# Patient Record
Sex: Female | Born: 1935 | Race: White | Hispanic: No | Smoking: Former smoker
Health system: Southern US, Community
[De-identification: ages and names within clinical notes are randomized; demographics above are authoritative.]

## PROBLEM LIST (undated history)

## (undated) DIAGNOSIS — I10 Essential (primary) hypertension: Secondary | ICD-10-CM

## (undated) DIAGNOSIS — J189 Pneumonia, unspecified organism: Secondary | ICD-10-CM

## (undated) DIAGNOSIS — E7800 Pure hypercholesterolemia, unspecified: Secondary | ICD-10-CM

## (undated) DIAGNOSIS — T4145XA Adverse effect of unspecified anesthetic, initial encounter: Secondary | ICD-10-CM

## (undated) DIAGNOSIS — R0602 Shortness of breath: Secondary | ICD-10-CM

## (undated) DIAGNOSIS — M7070 Other bursitis of hip, unspecified hip: Secondary | ICD-10-CM

## (undated) DIAGNOSIS — R413 Other amnesia: Secondary | ICD-10-CM

## (undated) DIAGNOSIS — J40 Bronchitis, not specified as acute or chronic: Secondary | ICD-10-CM

## (undated) DIAGNOSIS — F419 Anxiety disorder, unspecified: Secondary | ICD-10-CM

## (undated) DIAGNOSIS — K219 Gastro-esophageal reflux disease without esophagitis: Secondary | ICD-10-CM

## (undated) DIAGNOSIS — T8859XA Other complications of anesthesia, initial encounter: Secondary | ICD-10-CM

## (undated) DIAGNOSIS — F32A Depression, unspecified: Secondary | ICD-10-CM

## (undated) DIAGNOSIS — F329 Major depressive disorder, single episode, unspecified: Secondary | ICD-10-CM

## (undated) HISTORY — PX: CATARACT EXTRACTION: SUR2

## (undated) HISTORY — PX: CHOLECYSTECTOMY: SHX55

## (undated) HISTORY — PX: KNEE SURGERY: SHX244

---

## 1998-03-03 ENCOUNTER — Ambulatory Visit (HOSPITAL_COMMUNITY): Admission: RE | Admit: 1998-03-03 | Discharge: 1998-03-03 | Payer: Self-pay | Admitting: Obstetrics and Gynecology

## 1999-01-29 ENCOUNTER — Emergency Department (HOSPITAL_COMMUNITY): Admission: EM | Admit: 1999-01-29 | Discharge: 1999-01-29 | Payer: Self-pay | Admitting: Emergency Medicine

## 1999-01-29 ENCOUNTER — Encounter: Payer: Self-pay | Admitting: Emergency Medicine

## 1999-03-27 ENCOUNTER — Other Ambulatory Visit: Admission: RE | Admit: 1999-03-27 | Discharge: 1999-03-27 | Payer: Self-pay | Admitting: *Deleted

## 2000-05-01 ENCOUNTER — Encounter: Admission: RE | Admit: 2000-05-01 | Discharge: 2000-05-01 | Payer: Self-pay | Admitting: *Deleted

## 2000-05-10 ENCOUNTER — Encounter: Admission: RE | Admit: 2000-05-10 | Discharge: 2000-05-10 | Payer: Self-pay | Admitting: *Deleted

## 2000-07-05 ENCOUNTER — Ambulatory Visit (HOSPITAL_COMMUNITY): Admission: RE | Admit: 2000-07-05 | Discharge: 2000-07-05 | Payer: Self-pay | Admitting: Gastroenterology

## 2001-06-09 ENCOUNTER — Ambulatory Visit: Admission: RE | Admit: 2001-06-09 | Discharge: 2001-06-09 | Payer: Self-pay | Admitting: *Deleted

## 2002-07-16 ENCOUNTER — Encounter: Admission: RE | Admit: 2002-07-16 | Discharge: 2002-07-16 | Payer: Self-pay | Admitting: *Deleted

## 2002-07-16 ENCOUNTER — Encounter: Payer: Self-pay | Admitting: *Deleted

## 2002-07-24 ENCOUNTER — Encounter: Payer: Self-pay | Admitting: *Deleted

## 2002-07-24 ENCOUNTER — Ambulatory Visit (HOSPITAL_COMMUNITY): Admission: RE | Admit: 2002-07-24 | Discharge: 2002-07-24 | Payer: Self-pay | Admitting: *Deleted

## 2002-08-20 ENCOUNTER — Other Ambulatory Visit: Admission: RE | Admit: 2002-08-20 | Discharge: 2002-08-20 | Payer: Self-pay | Admitting: Obstetrics and Gynecology

## 2013-02-14 DIAGNOSIS — F329 Major depressive disorder, single episode, unspecified: Secondary | ICD-10-CM | POA: Insufficient documentation

## 2013-02-14 DIAGNOSIS — E7800 Pure hypercholesterolemia, unspecified: Secondary | ICD-10-CM | POA: Insufficient documentation

## 2013-02-14 DIAGNOSIS — R490 Dysphonia: Secondary | ICD-10-CM | POA: Insufficient documentation

## 2013-02-14 DIAGNOSIS — M1610 Unilateral primary osteoarthritis, unspecified hip: Secondary | ICD-10-CM | POA: Insufficient documentation

## 2013-02-14 DIAGNOSIS — I10 Essential (primary) hypertension: Secondary | ICD-10-CM | POA: Insufficient documentation

## 2013-02-14 DIAGNOSIS — M76899 Other specified enthesopathies of unspecified lower limb, excluding foot: Secondary | ICD-10-CM | POA: Insufficient documentation

## 2013-02-14 DIAGNOSIS — K219 Gastro-esophageal reflux disease without esophagitis: Secondary | ICD-10-CM | POA: Insufficient documentation

## 2013-04-03 DIAGNOSIS — F411 Generalized anxiety disorder: Secondary | ICD-10-CM | POA: Insufficient documentation

## 2013-11-11 ENCOUNTER — Emergency Department (HOSPITAL_BASED_OUTPATIENT_CLINIC_OR_DEPARTMENT_OTHER)
Admission: EM | Admit: 2013-11-11 | Discharge: 2013-11-11 | Disposition: A | Discharge status: Home / Self Care | Payer: Medicare Other | Attending: Emergency Medicine | Admitting: Emergency Medicine

## 2013-11-11 ENCOUNTER — Encounter (HOSPITAL_BASED_OUTPATIENT_CLINIC_OR_DEPARTMENT_OTHER): Payer: Self-pay | Admitting: Emergency Medicine

## 2013-11-11 ENCOUNTER — Emergency Department (HOSPITAL_BASED_OUTPATIENT_CLINIC_OR_DEPARTMENT_OTHER): Payer: Medicare Other

## 2013-11-11 DIAGNOSIS — F329 Major depressive disorder, single episode, unspecified: Secondary | ICD-10-CM | POA: Insufficient documentation

## 2013-11-11 DIAGNOSIS — Z87891 Personal history of nicotine dependence: Secondary | ICD-10-CM | POA: Insufficient documentation

## 2013-11-11 DIAGNOSIS — I10 Essential (primary) hypertension: Secondary | ICD-10-CM | POA: Insufficient documentation

## 2013-11-11 DIAGNOSIS — Z79899 Other long term (current) drug therapy: Secondary | ICD-10-CM | POA: Insufficient documentation

## 2013-11-11 DIAGNOSIS — F411 Generalized anxiety disorder: Secondary | ICD-10-CM | POA: Insufficient documentation

## 2013-11-11 DIAGNOSIS — Z7982 Long term (current) use of aspirin: Secondary | ICD-10-CM | POA: Insufficient documentation

## 2013-11-11 DIAGNOSIS — R05 Cough: Secondary | ICD-10-CM

## 2013-11-11 DIAGNOSIS — Z8719 Personal history of other diseases of the digestive system: Secondary | ICD-10-CM | POA: Insufficient documentation

## 2013-11-11 DIAGNOSIS — R059 Cough, unspecified: Secondary | ICD-10-CM | POA: Insufficient documentation

## 2013-11-11 DIAGNOSIS — E7800 Pure hypercholesterolemia, unspecified: Secondary | ICD-10-CM | POA: Insufficient documentation

## 2013-11-11 DIAGNOSIS — Z8739 Personal history of other diseases of the musculoskeletal system and connective tissue: Secondary | ICD-10-CM | POA: Insufficient documentation

## 2013-11-11 DIAGNOSIS — Z8709 Personal history of other diseases of the respiratory system: Secondary | ICD-10-CM | POA: Insufficient documentation

## 2013-11-11 DIAGNOSIS — F3289 Other specified depressive episodes: Secondary | ICD-10-CM | POA: Insufficient documentation

## 2013-11-11 HISTORY — DX: Other bursitis of hip, unspecified hip: M70.70

## 2013-11-11 HISTORY — DX: Shortness of breath: R06.02

## 2013-11-11 HISTORY — DX: Major depressive disorder, single episode, unspecified: F32.9

## 2013-11-11 HISTORY — DX: Gastro-esophageal reflux disease without esophagitis: K21.9

## 2013-11-11 HISTORY — DX: Bronchitis, not specified as acute or chronic: J40

## 2013-11-11 HISTORY — DX: Depression, unspecified: F32.A

## 2013-11-11 HISTORY — DX: Pure hypercholesterolemia, unspecified: E78.00

## 2013-11-11 HISTORY — DX: Essential (primary) hypertension: I10

## 2013-11-11 HISTORY — DX: Anxiety disorder, unspecified: F41.9

## 2013-11-11 HISTORY — DX: Other amnesia: R41.3

## 2013-11-11 LAB — COMPREHENSIVE METABOLIC PANEL
ALBUMIN: 3.6 g/dL (ref 3.5–5.2)
ALT: 14 U/L (ref 0–35)
AST: 22 U/L (ref 0–37)
Alkaline Phosphatase: 104 U/L (ref 39–117)
BILIRUBIN TOTAL: 0.4 mg/dL (ref 0.3–1.2)
BUN: 18 mg/dL (ref 6–23)
CALCIUM: 10.1 mg/dL (ref 8.4–10.5)
CHLORIDE: 103 meq/L (ref 96–112)
CO2: 25 mEq/L (ref 19–32)
CREATININE: 0.7 mg/dL (ref 0.50–1.10)
GFR calc Af Amer: 88 mL/min — ABNORMAL LOW (ref >90)
GFR calc non Af Amer: 76 mL/min — ABNORMAL LOW (ref >90)
Glucose, Bld: 106 mg/dL — ABNORMAL HIGH (ref 70–99)
Potassium: 4.3 mEq/L (ref 3.7–5.3)
Sodium: 142 mEq/L (ref 137–147)
Total Protein: 7.2 g/dL (ref 6.0–8.3)

## 2013-11-11 LAB — CBC WITH DIFFERENTIAL/PLATELET
Basophils Absolute: 0 10*3/uL (ref 0.0–0.1)
Basophils Relative: 1 % (ref 0–1)
Eosinophils Absolute: 0.2 10*3/uL (ref 0.0–0.7)
Eosinophils Relative: 2 % (ref 0–5)
HCT: 39.1 % (ref 36.0–46.0)
HEMOGLOBIN: 12.9 g/dL (ref 12.0–15.0)
LYMPHS ABS: 1.5 10*3/uL (ref 0.7–4.0)
LYMPHS PCT: 22 % (ref 12–46)
MCH: 29.9 pg (ref 26.0–34.0)
MCHC: 33 g/dL (ref 30.0–36.0)
MCV: 90.7 fL (ref 78.0–100.0)
MONOS PCT: 14 % — AB (ref 3–12)
Monocytes Absolute: 1 10*3/uL (ref 0.1–1.0)
NEUTROS PCT: 62 % (ref 43–77)
Neutro Abs: 4.3 10*3/uL (ref 1.7–7.7)
Platelets: 250 10*3/uL (ref 150–400)
RBC: 4.31 MIL/uL (ref 3.87–5.11)
RDW: 14.2 % (ref 11.5–15.5)
WBC: 7 10*3/uL (ref 4.0–10.5)

## 2013-11-11 LAB — PRO B NATRIURETIC PEPTIDE: Pro B Natriuretic peptide (BNP): 352 pg/mL (ref 0–450)

## 2013-11-11 LAB — TROPONIN I

## 2013-11-11 NOTE — ED Notes (Signed)
Pt from [facility].  States that she has had SOB x 2 weeks, worse with exertion.  Denies pain.  Reports cough with mucus.

## 2013-11-11 NOTE — ED Provider Notes (Signed)
CSN: [id number]     Arrival date & time 11/11/13  1556 History   First MD Initiated Contact with Patient 11/11/13 1640     Chief Complaint  Patient presents with  . Shortness of Breath     (Consider location/radiation/quality/duration/timing/severity/associated sxs/prior Treatment) Patient is a 80 y.o. female presenting with shortness of breath. The history is provided by the patient. No language interpreter was used.  Shortness of Breath Severity:  Mild Onset quality:  Gradual Duration:  2 weeks Timing:  Constant Progression:  Worsening Chronicity:  New Context: activity   Relieved by:  Nothing Worsened by:  Nothing tried Ineffective treatments:  None tried Associated symptoms: cough   Pt complains of a cough for the past 2 weeks.  Pt reports some increased shortness of breath. Pt reports she went to the [facility] and she was told to come here.   Pt reports her heart rate was high at 120 and then dropped to 60.    Past Medical History  Diagnosis Date  . Bronchitis   . Anxiety   . Bursitis of hip   . Depression   . GERD (gastroesophageal reflux disease)   . Hypercholesteremia   . Hypertension   . Memory loss   . Shortness of breath    Past Surgical History  Procedure Laterality Date  . Cholecystectomy    . Knee surgery     History reviewed. No pertinent family history. History  Substance Use Topics  . Smoking status: Former Games developer  . Smokeless tobacco: Not on file  . Alcohol Use: No   OB History   Grav Para Term Preterm Abortions TAB SAB Ect Mult Living                 Review of Systems  Respiratory: Positive for cough and shortness of breath.   All other systems reviewed and are negative.      Allergies  Review of patient's allergies indicates no known allergies.  Home Medications   Current Outpatient Rx  Name  Route  Sig  Dispense  Refill  . Ascorbic Acid (VITAMIN C) 100 MG tablet   Oral   Take 100 mg by mouth daily.         Marland Kitchen aspirin  81 MG tablet   Oral   Take 81 mg by mouth daily.         Marland Kitchen atorvastatin (LIPITOR) 10 MG tablet   Oral   Take 10 mg by mouth daily.         . cholecalciferol (VITAMIN D) 1000 UNITS tablet   Oral   Take 1,000 Units by mouth daily.         Marland Kitchen diltiazem (CARDIZEM) 120 MG tablet   Oral   Take 180 mg by mouth 4 (four) times daily.         . Multiple Vitamins-Minerals (OSTEO COMPLEX PO)   Oral   Take by mouth.         . venlafaxine (EFFEXOR) 75 MG tablet   Oral   Take 75 mg by mouth 2 (two) times daily.         . vitamin B-12 (CYANOCOBALAMIN) 100 MCG tablet   Oral   Take 100 mcg by mouth daily.          BP 110/76  Pulse 87  Temp(Src) 98.4 F (36.9 C) (Oral)  Resp 18  Ht 5' (1.524 m)  Wt 125 lb (56.7 kg)  BMI 24.41 kg/m2  SpO2 96% Physical Exam  Nursing note and vitals reviewed. Constitutional: She is oriented to person, place, and time. She appears well-developed and well-nourished.  HENT:  Head: Normocephalic.  Right Ear: External ear normal.  Left Ear: External ear normal.  Nose: Nose normal.  Mouth/Throat: Oropharynx is clear and moist.  Eyes: Conjunctivae and EOM are normal. Pupils are equal, round, and reactive to light.  Neck: Normal range of motion.  Cardiovascular: Normal rate and intact distal pulses.   Pulmonary/Chest: Effort normal and breath sounds normal.  Abdominal: Soft. She exhibits no distension.  Musculoskeletal: Normal range of motion.  Neurological: She is alert and oriented to person, place, and time.  Skin: Skin is warm.  Psychiatric: She has a normal mood and affect.    ED Course  Procedures (including critical care time) Labs Review Labs Reviewed  COMPREHENSIVE METABOLIC PANEL - Abnormal; Notable for the following:    Glucose, Bld 106 (*)    GFR calc non Af Amer 76 (*)    GFR calc Af Amer 88 (*)    All other components within normal limits  CBC WITH DIFFERENTIAL - Abnormal; Notable for the following:    Monocytes  Relative 14 (*)    All other components within normal limits  TROPONIN I  PRO B NATRIURETIC PEPTIDE   Imaging Review Dg Chest 2 View  11/11/2013   CLINICAL DATA:  Progressive dyspnea and palpitations  EXAM: CHEST  2 VIEW  COMPARISON:  None.  FINDINGS: The lungs are mildly hyperinflated. The interstitial markings increased. The cardiac silhouette is normal in size. There is mild tortuosity of the descending thoracic aorta. The pulmonary vascularity is not engorged. There is degenerative disc change at multiple thoracic levels with mild loss of height of the lower thoracic vertebral bodies.  IMPRESSION: The findings are consistent with COPD. There may be an element of pulmonary fibrosis present. There is no evidence of CHF nor of pneumonia.   Electronically Signed   By: David  SwazilandJordan   On: 11/11/2013 17:36     EKG Interpretation None      MDM   Final diagnoses:  Cough    Labs are normal,   Ekg nonacute.   No pneumonia on chest xray.   Pt advised to follow up with her MD.      Elson AreasLeslie K Sofia, PA-C 11/11/13 1848

## 2013-11-11 NOTE — Discharge Instructions (Signed)
Cough, Adult  A cough is a reflex. It helps you clear your throat and airways. A cough can help heal your body. A cough can last 2 or 3 weeks (acute) or may last more than 8 weeks (chronic). Some common causes of a cough can include an infection, allergy, or a cold. HOME CARE  Only take medicine as told by your doctor.  If given, take your medicines (antibiotics) as told. Finish them even if you start to feel better.  Use a cold steam vaporizer or humidier in your home. This can help loosen thick spit (secretions).  Sleep so you are almost sitting up (semi-upright). Use pillows to do this. This helps reduce coughing.  Rest as needed.  Stop smoking if you smoke. GET HELP RIGHT AWAY IF:  You have yellowish-white fluid (pus) in your thick spit.  Your cough gets worse.  Your medicine does not reduce coughing, and you are losing sleep.  You cough up blood.  You have trouble breathing.  Your pain gets worse and medicine does not help.  You have a fever. MAKE SURE YOU:   Understand these instructions.  Will watch your condition.  Will get help right away if you are not doing well or get worse. Document Released: 05/10/2011 Document Revised: 11/19/2011 Document Reviewed: 05/10/2011 Exit[employer] Patient Information 2014 [employer].  

## 2013-11-11 NOTE — ED Notes (Signed)
Intermittently, patietn's sat's will drop as low as 87% with no exertion. Pt becomes shob.

## 2013-11-18 NOTE — ED Provider Notes (Signed)
Medical screening examination/treatment/procedure(s) were performed by non-physician practitioner and as supervising physician I was immediately available for consultation/collaboration.   EKG Interpretation   Date/Time:  Wednesday November 11 2013 16:11:50 EST Ventricular Rate:  86 PR Interval:  168 QRS Duration: 74 QT Interval:  348 QTC Calculation: 416 R Axis:   -54 Text Interpretation:  Normal sinus rhythm Left axis deviation Inferior  infarct , age undetermined Anterior infarct , age undetermined Abnormal  ECG Confirmed by Fayrene FearingJAMES  MD, Shaunak Kreis ([id number]) on 11/11/2013 11:20:50 PM        Rolland PorterMark Grettell Ransdell, MD 11/18/13 [id number]

## 2013-12-16 DIAGNOSIS — M25519 Pain in unspecified shoulder: Secondary | ICD-10-CM | POA: Insufficient documentation

## 2014-10-12 DIAGNOSIS — R413 Other amnesia: Secondary | ICD-10-CM | POA: Insufficient documentation

## 2016-07-10 DIAGNOSIS — R0789 Other chest pain: Secondary | ICD-10-CM | POA: Insufficient documentation

## 2016-07-10 DIAGNOSIS — R000 Tachycardia, unspecified: Secondary | ICD-10-CM | POA: Insufficient documentation

## 2016-09-11 ENCOUNTER — Encounter (HOSPITAL_BASED_OUTPATIENT_CLINIC_OR_DEPARTMENT_OTHER): Payer: Self-pay | Admitting: *Deleted

## 2016-09-11 ENCOUNTER — Inpatient Hospital Stay (HOSPITAL_BASED_OUTPATIENT_CLINIC_OR_DEPARTMENT_OTHER)
Admission: EM | Admit: 2016-09-11 | Discharge: 2016-09-17 | DRG: 871 | Disposition: A | Discharge status: Skilled Nursing Facility | Payer: Medicare Other | Attending: Internal Medicine | Admitting: Internal Medicine

## 2016-09-11 ENCOUNTER — Emergency Department (HOSPITAL_BASED_OUTPATIENT_CLINIC_OR_DEPARTMENT_OTHER): Payer: Medicare Other

## 2016-09-11 DIAGNOSIS — I480 Paroxysmal atrial fibrillation: Secondary | ICD-10-CM | POA: Diagnosis present

## 2016-09-11 DIAGNOSIS — R011 Cardiac murmur, unspecified: Secondary | ICD-10-CM | POA: Diagnosis present

## 2016-09-11 DIAGNOSIS — W010XXA Fall on same level from slipping, tripping and stumbling without subsequent striking against object, initial encounter: Secondary | ICD-10-CM | POA: Diagnosis present

## 2016-09-11 DIAGNOSIS — Z87891 Personal history of nicotine dependence: Secondary | ICD-10-CM

## 2016-09-11 DIAGNOSIS — K219 Gastro-esophageal reflux disease without esophagitis: Secondary | ICD-10-CM | POA: Diagnosis present

## 2016-09-11 DIAGNOSIS — M7070 Other bursitis of hip, unspecified hip: Secondary | ICD-10-CM | POA: Diagnosis present

## 2016-09-11 DIAGNOSIS — J439 Emphysema, unspecified: Secondary | ICD-10-CM | POA: Diagnosis present

## 2016-09-11 DIAGNOSIS — I471 Supraventricular tachycardia: Secondary | ICD-10-CM

## 2016-09-11 DIAGNOSIS — J189 Pneumonia, unspecified organism: Secondary | ICD-10-CM | POA: Diagnosis present

## 2016-09-11 DIAGNOSIS — A419 Sepsis, unspecified organism: Secondary | ICD-10-CM | POA: Diagnosis not present

## 2016-09-11 DIAGNOSIS — R0600 Dyspnea, unspecified: Secondary | ICD-10-CM | POA: Diagnosis not present

## 2016-09-11 DIAGNOSIS — E876 Hypokalemia: Secondary | ICD-10-CM | POA: Diagnosis present

## 2016-09-11 DIAGNOSIS — E878 Other disorders of electrolyte and fluid balance, not elsewhere classified: Secondary | ICD-10-CM | POA: Diagnosis present

## 2016-09-11 DIAGNOSIS — I10 Essential (primary) hypertension: Secondary | ICD-10-CM | POA: Diagnosis present

## 2016-09-11 DIAGNOSIS — S63502A Unspecified sprain of left wrist, initial encounter: Secondary | ICD-10-CM | POA: Diagnosis present

## 2016-09-11 DIAGNOSIS — F329 Major depressive disorder, single episode, unspecified: Secondary | ICD-10-CM | POA: Diagnosis present

## 2016-09-11 DIAGNOSIS — R000 Tachycardia, unspecified: Secondary | ICD-10-CM | POA: Diagnosis present

## 2016-09-11 DIAGNOSIS — E860 Dehydration: Secondary | ICD-10-CM | POA: Diagnosis present

## 2016-09-11 DIAGNOSIS — I4891 Unspecified atrial fibrillation: Secondary | ICD-10-CM | POA: Diagnosis present

## 2016-09-11 DIAGNOSIS — E7800 Pure hypercholesterolemia, unspecified: Secondary | ICD-10-CM | POA: Diagnosis present

## 2016-09-11 DIAGNOSIS — R413 Other amnesia: Secondary | ICD-10-CM | POA: Diagnosis present

## 2016-09-11 DIAGNOSIS — E871 Hypo-osmolality and hyponatremia: Secondary | ICD-10-CM | POA: Diagnosis present

## 2016-09-11 DIAGNOSIS — Z79899 Other long term (current) drug therapy: Secondary | ICD-10-CM | POA: Diagnosis not present

## 2016-09-11 DIAGNOSIS — Z7982 Long term (current) use of aspirin: Secondary | ICD-10-CM | POA: Diagnosis not present

## 2016-09-11 DIAGNOSIS — Z8249 Family history of ischemic heart disease and other diseases of the circulatory system: Secondary | ICD-10-CM

## 2016-09-11 DIAGNOSIS — I081 Rheumatic disorders of both mitral and tricuspid valves: Secondary | ICD-10-CM | POA: Diagnosis present

## 2016-09-11 DIAGNOSIS — I272 Pulmonary hypertension, unspecified: Secondary | ICD-10-CM | POA: Diagnosis present

## 2016-09-11 DIAGNOSIS — M25432 Effusion, left wrist: Secondary | ICD-10-CM | POA: Diagnosis present

## 2016-09-11 DIAGNOSIS — W19XXXA Unspecified fall, initial encounter: Secondary | ICD-10-CM | POA: Insufficient documentation

## 2016-09-11 DIAGNOSIS — I4719 Other supraventricular tachycardia: Secondary | ICD-10-CM

## 2016-09-11 HISTORY — DX: Other complications of anesthesia, initial encounter: T88.59XA

## 2016-09-11 HISTORY — DX: Adverse effect of unspecified anesthetic, initial encounter: T41.45XA

## 2016-09-11 HISTORY — DX: Pneumonia, unspecified organism: J18.9

## 2016-09-11 LAB — URINALYSIS, ROUTINE W REFLEX MICROSCOPIC
Bilirubin Urine: NEGATIVE
Glucose, UA: NEGATIVE mg/dL
Hgb urine dipstick: NEGATIVE
KETONES UR: 15 mg/dL — AB
LEUKOCYTES UA: NEGATIVE
Nitrite: NEGATIVE
PROTEIN: NEGATIVE mg/dL
Specific Gravity, Urine: 1.007 (ref 1.005–1.030)
pH: 6 (ref 5.0–8.0)

## 2016-09-11 LAB — CBC WITH DIFFERENTIAL/PLATELET
BASOS PCT: 0 %
Band Neutrophils: 2 %
Basophils Absolute: 0 10*3/uL (ref 0.0–0.1)
Eosinophils Absolute: 0 10*3/uL (ref 0.0–0.7)
Eosinophils Relative: 0 %
HEMATOCRIT: 38.6 % (ref 36.0–46.0)
Hemoglobin: 12.5 g/dL (ref 12.0–15.0)
LYMPHS ABS: 0.3 10*3/uL — AB (ref 0.7–4.0)
LYMPHS PCT: 2 %
MCH: 28.9 pg (ref 26.0–34.0)
MCHC: 32.4 g/dL (ref 30.0–36.0)
MCV: 89.4 fL (ref 78.0–100.0)
Monocytes Absolute: 0.8 10*3/uL (ref 0.1–1.0)
Monocytes Relative: 6 %
NEUTROS PCT: 90 %
Neutro Abs: 12.2 10*3/uL — ABNORMAL HIGH (ref 1.7–7.7)
PLATELETS: 241 10*3/uL (ref 150–400)
RBC: 4.32 MIL/uL (ref 3.87–5.11)
RDW: 14.1 % (ref 11.5–15.5)
WBC: 13.3 10*3/uL — AB (ref 4.0–10.5)

## 2016-09-11 LAB — COMPREHENSIVE METABOLIC PANEL
ALT: 17 U/L (ref 14–54)
ANION GAP: 11 (ref 5–15)
AST: 30 U/L (ref 15–41)
Albumin: 3.3 g/dL — ABNORMAL LOW (ref 3.5–5.0)
Alkaline Phosphatase: 98 U/L (ref 38–126)
BUN: 11 mg/dL (ref 6–20)
CHLORIDE: 100 mmol/L — AB (ref 101–111)
CO2: 22 mmol/L (ref 22–32)
CREATININE: 0.62 mg/dL (ref 0.44–1.00)
Calcium: 8.8 mg/dL — ABNORMAL LOW (ref 8.9–10.3)
GFR calc Af Amer: >60 mL/min (ref >60)
Glucose, Bld: 109 mg/dL — ABNORMAL HIGH (ref 65–99)
Potassium: 3.2 mmol/L — ABNORMAL LOW (ref 3.5–5.1)
Sodium: 133 mmol/L — ABNORMAL LOW (ref 135–145)
Total Bilirubin: 1.6 mg/dL — ABNORMAL HIGH (ref 0.3–1.2)
Total Protein: 7 g/dL (ref 6.5–8.1)

## 2016-09-11 LAB — I-STAT CG4 LACTIC ACID, ED
Lactic Acid, Venous: 2.08 mmol/L (ref 0.5–1.9)
Lactic Acid, Venous: 0.97 mmol/L (ref 0.5–1.9)

## 2016-09-11 LAB — TROPONIN I: Troponin I: <0.03 ng/mL (ref <0.03)

## 2016-09-11 MED ORDER — VITAMIN B-12 100 MCG PO TABS
100.0000 ug | ORAL_TABLET | Freq: Every day | ORAL | Status: DC
  Administered 2016-09-12 – 2016-09-17 (×6): 100 ug via ORAL
  Filled 2016-09-11 (×6): qty 1

## 2016-09-11 MED ORDER — ACETAMINOPHEN 325 MG PO TABS: 650.0000 mg | ORAL_TABLET | Freq: Four times a day (QID) | ORAL | Status: DC | PRN

## 2016-09-11 MED ORDER — SODIUM CHLORIDE 0.9 % IV BOLUS (SEPSIS)
500.0000 mL | Freq: Once | INTRAVENOUS | Status: AC
  Administered 2016-09-11: 500 mL via INTRAVENOUS

## 2016-09-11 MED ORDER — SODIUM CHLORIDE 0.9 % IV SOLN
INTRAVENOUS | Status: AC
  Administered 2016-09-11: 20:00:00 via INTRAVENOUS

## 2016-09-11 MED ORDER — DEXTROSE 5 % IV SOLN
500.0000 mg | INTRAVENOUS | Status: DC
  Administered 2016-09-12: 500 mg via INTRAVENOUS
  Filled 2016-09-11 (×2): qty 500

## 2016-09-11 MED ORDER — DILTIAZEM LOAD VIA INFUSION
10.0000 mg | Freq: Once | INTRAVENOUS | Status: AC
  Administered 2016-09-11: 10 mg via INTRAVENOUS
  Filled 2016-09-11: qty 10

## 2016-09-11 MED ORDER — DEXTROSE 5 % IV SOLN
1.0000 g | INTRAVENOUS | Status: DC
  Administered 2016-09-12 – 2016-09-16 (×5): 1 g via INTRAVENOUS
  Filled 2016-09-11 (×6): qty 10

## 2016-09-11 MED ORDER — DEXTROSE 5 % IV SOLN
500.0000 mg | Freq: Once | INTRAVENOUS | Status: AC
  Administered 2016-09-11: 500 mg via INTRAVENOUS

## 2016-09-11 MED ORDER — DILTIAZEM HCL 60 MG PO TABS: 180.0000 mg | ORAL_TABLET | Freq: Four times a day (QID) | ORAL | Status: DC

## 2016-09-11 MED ORDER — ENOXAPARIN SODIUM 40 MG/0.4ML ~~LOC~~ SOLN
40.0000 mg | SUBCUTANEOUS | Status: DC
  Administered 2016-09-11 – 2016-09-16 (×6): 40 mg via SUBCUTANEOUS
  Filled 2016-09-11 (×6): qty 0.4

## 2016-09-11 MED ORDER — TRAMADOL HCL 50 MG PO TABS: 25.0000 mg | ORAL_TABLET | Freq: Once | ORAL | Status: DC

## 2016-09-11 MED ORDER — DILTIAZEM HCL 25 MG/5ML IV SOLN
10.0000 mg | INTRAVENOUS | Status: AC
  Administered 2016-09-11: 10 mg via INTRAVENOUS
  Filled 2016-09-11: qty 5

## 2016-09-11 MED ORDER — DILTIAZEM HCL 100 MG IV SOLR
5.0000 mg/h | INTRAVENOUS | Status: DC
  Administered 2016-09-11: 5 mg/h via INTRAVENOUS
  Administered 2016-09-12 (×2): 10 mg/h via INTRAVENOUS
  Filled 2016-09-11 (×4): qty 100

## 2016-09-11 MED ORDER — DILTIAZEM HCL 25 MG/5ML IV SOLN
10.0000 mg | Freq: Once | INTRAVENOUS | Status: DC
  Filled 2016-09-11: qty 5

## 2016-09-11 MED ORDER — SODIUM CHLORIDE 0.9 % IV BOLUS (SEPSIS)
250.0000 mL | Freq: Once | INTRAVENOUS | Status: AC
  Administered 2016-09-11: 250 mL via INTRAVENOUS

## 2016-09-11 MED ORDER — POTASSIUM CHLORIDE CRYS ER 20 MEQ PO TBCR
40.0000 meq | EXTENDED_RELEASE_TABLET | Freq: Once | ORAL | Status: AC
  Administered 2016-09-11: 40 meq via ORAL
  Filled 2016-09-11: qty 2

## 2016-09-11 MED ORDER — MAGNESIUM SULFATE IN D5W 1-5 GM/100ML-% IV SOLN
1.0000 g | Freq: Once | INTRAVENOUS | Status: AC
  Administered 2016-09-11: 1 g via INTRAVENOUS
  Filled 2016-09-11: qty 100

## 2016-09-11 MED ORDER — VITAMIN D 1000 UNITS PO TABS
1000.0000 [IU] | ORAL_TABLET | Freq: Every day | ORAL | Status: DC
  Administered 2016-09-12 – 2016-09-17 (×6): 1000 [IU] via ORAL
  Filled 2016-09-11 (×6): qty 1

## 2016-09-11 MED ORDER — VENLAFAXINE HCL 75 MG PO TABS
75.0000 mg | ORAL_TABLET | Freq: Two times a day (BID) | ORAL | Status: DC
  Administered 2016-09-11 – 2016-09-17 (×12): 75 mg via ORAL
  Filled 2016-09-11 (×14): qty 1

## 2016-09-11 MED ORDER — ASPIRIN EC 81 MG PO TBEC
81.0000 mg | DELAYED_RELEASE_TABLET | Freq: Every day | ORAL | Status: DC
  Administered 2016-09-12 – 2016-09-17 (×6): 81 mg via ORAL
  Filled 2016-09-11 (×6): qty 1

## 2016-09-11 MED ORDER — AZITHROMYCIN 500 MG IV SOLR
INTRAVENOUS | Status: AC
  Filled 2016-09-11: qty 500

## 2016-09-11 MED ORDER — CEFTRIAXONE SODIUM 1 G IJ SOLR
1.0000 g | Freq: Once | INTRAMUSCULAR | Status: AC
  Administered 2016-09-11: 1 g via INTRAVENOUS
  Filled 2016-09-11: qty 10

## 2016-09-11 MED ORDER — HYDROCOD POLST-CPM POLST ER 10-8 MG/5ML PO SUER
5.0000 mL | Freq: Once | ORAL | Status: AC
  Administered 2016-09-11: 5 mL via ORAL
  Filled 2016-09-11: qty 5

## 2016-09-11 MED ORDER — ACETAMINOPHEN 325 MG PO TABS
650.0000 mg | ORAL_TABLET | Freq: Once | ORAL | Status: AC
  Administered 2016-09-11: 650 mg via ORAL
  Filled 2016-09-11: qty 2

## 2016-09-11 MED ORDER — ASPIRIN 81 MG PO TABS: 81.0000 mg | ORAL_TABLET | Freq: Every day | ORAL | Status: DC

## 2016-09-11 MED ORDER — SODIUM CHLORIDE 0.9 % IV BOLUS (SEPSIS)
1000.0000 mL | Freq: Once | INTRAVENOUS | Status: AC
Start: 2016-09-11 — End: 2016-09-11
  Administered 2016-09-11: 1000 mL via INTRAVENOUS

## 2016-09-11 NOTE — ED Notes (Signed)
Pt refused tramadol for now states she does not have pain.

## 2016-09-11 NOTE — ED Notes (Signed)
Placing ice first then applied the wrist splint and [facility] informed.

## 2016-09-11 NOTE — Progress Notes (Signed)
Merdis DelayK. Schorr NP notified previously regarding dosing of patients home PO Cardizem dose. Orders for Cardizem 180 mg four times daily. States she only takes this once daily and has already taken for today. Family unsure of medication dosage and to bring list tomorrow. In process of Pharmacy attempting to contact facility to confirm medication list, Pts HR sustaining 140's and up to 150-160's nonsustained/AFIB. Orders received for cardizem 10mg  bolus and to start Cardizem drip with parameters for HR/BP. Drip started per orders and will continue to monitor. Dierdre HighmanHall, Jakhiya Brower Marie, RN

## 2016-09-11 NOTE — Progress Notes (Signed)
RN paged attending that accepted patient upon arrival to the floor

## 2016-09-11 NOTE — ED Triage Notes (Addendum)
C/o cough x 3 days. NP. No fever feels weak. Also states she fell going out of church on Sunday. C/o left wrist pain and has swelling noted.

## 2016-09-11 NOTE — H&P (Signed)
History and Physical    Savannah Gutierrez ZOX:[id number] DOB: Nov 09, 1924 DOA: 09/11/2016  PCP: No PCP Per Patient   Patient coming from: Home, by way of [facility]   Chief Complaint: Dyspnea, cough, weakness  HPI: Savannah Gutierrez is a 83 y.o. female with medical history significant for hypertension, depression, and recent diagnosis of tachycardia with new prescription for diltiazem, presenting in transfer from [facility] where she was seen for 3 days of generalized weakness, cough, and dyspnea. Patient reports that she was in her usual state of health until approximately 3 days ago when she developed a nonproductive cough, generalized weakness, and dyspnea with minimal exertion. She denies any chest pain or palpitations during this interval, and has not noted any fevers or chills. She denies any lower extremity edema, calf tenderness, recent long distance travel, prolonged immobilization, or personal or family history of VTE. She has been taking Delsym for the past 2 days without any significant relief. Patient also tripped and fell onto her outstretched left hand on 09/09/2016 and has had pain and swelling of the left wrist since then.  [facility] ED Course: Upon arrival to the [facility] ED, patient is found to be febrile to 38.8 C, saturating adequately on room air, tachypneic in the 30s, tachycardic in the 120s, and with stable blood pressure. EKG features in atrial fibrillation with rate 126 and LVH. Chest x-ray is notable for emphysema with superimposed bibasilar airspace disease concerning for pneumonia. Radiographs of the left wrist are negative for fracture, but notable for widened scapholunate space concerning for a ligamentous injury. Chemistry panel features mild hyponatremia and hypochloremia and CBC is notable for mild leukocytosis to 13,300. Initial lactate is elevated to 2.08, normalizing to 0.97 after the fluid. Troponin is undetectable and urinalysis is unremarkable. Blood and urine cultures were obtained,  respiratory virus panel was sent, patient was given 30 cc/kg bolus of normal saline, and started on empiric Rocephin and azithromycin for suspected community-acquired pneumonia. She was also treated with 40 mEq of oral potassium and acetaminophen. Patient remained mildly tachycardic in the emergency department with stable blood pressure and transfer to [facility] was arranged for admission.  Patient is interviewed and examined on the stepdown unit [facility] where she will be admitted for ongoing evaluation and management of community-acquired pneumonia with atrial fibrillation, now in RVR with rate 150.  Review of Systems:  All other systems reviewed and apart from HPI, are negative.  Past Medical History:  Diagnosis Date  . Anxiety   . Bronchitis   . Bursitis of hip   . Complication of anesthesia    I  HAD DIFFICULTY WAKING ONE TIME "  . Depression   . GERD (gastroesophageal reflux disease)   . Hypercholesteremia   . Hypertension   . Memory loss   . Pneumonia 09/11/2016  . Shortness of breath     Past Surgical History:  Procedure Laterality Date  . CATARACT EXTRACTION    . CHOLECYSTECTOMY    . KNEE SURGERY       reports that she has quit smoking. She has never used smokeless tobacco. She reports that she does not drink alcohol or use drugs.  No Known Allergies  History reviewed. No pertinent family history.   Prior to Admission medications   Medication Sig Start Date End Date Taking? Authorizing Provider  Ascorbic Acid (VITAMIN C) 100 MG tablet Take 100 mg by mouth daily.   Yes Historical Provider, MD  aspirin 81 MG tablet Take 81  mg by mouth daily.   Yes Historical Provider, MD  cholecalciferol (VITAMIN D) 1000 UNITS tablet Take 1,000 Units by mouth daily.   Yes Historical Provider, MD  diltiazem (CARDIZEM) 120 MG tablet Take 180 mg by mouth 4 (four) times daily.   Yes Historical Provider, MD  Multiple Vitamins-Minerals (OSTEO COMPLEX PO) Take by mouth.    Yes Historical Provider, MD  venlafaxine (EFFEXOR) 75 MG tablet Take 75 mg by mouth 2 (two) times daily.   Yes Historical Provider, MD  vitamin B-12 (CYANOCOBALAMIN) 100 MCG tablet Take 100 mcg by mouth daily.   Yes Historical Provider, MD    Physical Exam: Vitals:   09/11/16 1430 09/11/16 1500 09/11/16 1530 09/11/16 1734  BP: 96/56 105/69 102/70 103/64  Pulse: 93 100 91 (!) 106  Resp: (!) 28 24 (!) 35   Temp:    98.3 F (36.8 C)  TempSrc:    Oral  SpO2: 94% 96% 96% 99%  Weight:    53 kg (116 lb 12.8 oz)  Height:    5' (1.524 m)      Constitutional: NAD, calm, comfortable Eyes: PERTLA, lids and conjunctivae normal ENMT: Mucous membranes are moist. Posterior pharynx clear of any exudate or lesions.   Neck: normal, supple, no masses, no thyromegaly Respiratory: Rhonchi bilaterally, occasional expiratory wheeze. Normal respiratory effort.   Cardiovascular: Rate ~150 and irregular. No extremity edema. No significant JVD. Abdomen: No distension, no tenderness, no masses palpated. Bowel sounds normal.  Musculoskeletal: no clubbing / cyanosis. No joint deformity upper and lower extremities. Normal muscle tone.  Skin: no significant rashes, lesions, ulcers. Warm, dry, well-perfused. Neurologic: CN 2-12 grossly intact. Sensation intact, DTR normal. Strength 5/5 in all 4 limbs.  Psychiatric: Normal judgment and insight. Alert and oriented x 3. Normal mood and affect.     Labs on Admission: I have personally reviewed following labs and imaging studies  CBC:  Recent Labs Lab 09/11/16 1120  WBC 13.3*  NEUTROABS 12.2*  HGB 12.5  HCT 38.6  MCV 89.4  PLT 241   Basic Metabolic Panel:  Recent Labs Lab 09/11/16 1120  NA 133*  K 3.2*  CL 100*  CO2 22  GLUCOSE 109*  BUN 11  CREATININE 0.62  CALCIUM 8.8*   GFR: Estimated Creatinine Clearance: 34.2 mL/min (by C-G formula based on SCr of 0.62 mg/dL). Liver Function Tests:  Recent Labs Lab 09/11/16 1120  AST 30  ALT 17   ALKPHOS 98  BILITOT 1.6*  PROT 7.0  ALBUMIN 3.3*   No results for input(s): LIPASE, AMYLASE in the last 168 hours. No results for input(s): AMMONIA in the last 168 hours. Coagulation Profile: No results for input(s): INR, PROTIME in the last 168 hours. Cardiac Enzymes:  Recent Labs Lab 09/11/16 1120  TROPONINI <0.03   BNP (last 3 results) No results for input(s): PROBNP in the last 8760 hours. HbA1C: No results for input(s): HGBA1C in the last 72 hours. CBG: No results for input(s): GLUCAP in the last 168 hours. Lipid Profile: No results for input(s): CHOL, HDL, LDLCALC, TRIG, CHOLHDL, LDLDIRECT in the last 72 hours. Thyroid Function Tests: No results for input(s): TSH, T4TOTAL, FREET4, T3FREE, THYROIDAB in the last 72 hours. Anemia Panel: No results for input(s): VITAMINB12, FOLATE, FERRITIN, TIBC, IRON, RETICCTPCT in the last 72 hours. Urine analysis:    Component Value Date/Time   COLORURINE YELLOW 09/11/2016 1430   APPEARANCEUR CLEAR 09/11/2016 1430   LABSPEC 1.007 09/11/2016 1430   PHURINE 6.0 09/11/2016 1430  GLUCOSEU NEGATIVE 09/11/2016 1430   HGBUR NEGATIVE 09/11/2016 1430   BILIRUBINUR NEGATIVE 09/11/2016 1430   KETONESUR 15 (A) 09/11/2016 1430   PROTEINUR NEGATIVE 09/11/2016 1430   NITRITE NEGATIVE 09/11/2016 1430   LEUKOCYTESUR NEGATIVE 09/11/2016 1430   Sepsis Labs: @LABRCNTIP (procalcitonin:4,lacticidven:4) )No results found for this or any previous visit (from the past 240 hour(s)).   Radiological Exams on Admission: Dg Chest 2 View  Result Date: 09/11/2016 CLINICAL DATA:  Cough and congestion for 3 days. Progressive weakness. EXAM: CHEST  2 VIEW COMPARISON:  Two-view chest x-ray 07/12/2016 FINDINGS: The heart size is exaggerated by low lung volumes. Atherosclerotic calcifications are again noted in the aorta. Bibasilar airspace disease is superimposed on chronic interstitial disease bilaterally. There is no significant edema. No definite effusions are  present. IMPRESSION: 1. Bibasilar airspace disease concerning for pneumonia superimposed on chronic interstitial lung disease. 2. Emphysema. 3. Aortic atherosclerosis. Electronically Signed   By: Marin Roberts M.D.   On: 09/11/2016 11:14   Dg Wrist Complete Left  Result Date: 09/11/2016 CLINICAL DATA:  Fall 2 days ago, left wrist pain EXAM: LEFT WRIST - COMPLETE 3+ VIEW COMPARISON:  None. FINDINGS: Four views of the left wrist submitted. No acute fracture. Mild narrowing of radiocarpal joint space. Degenerative changes noted first carpometacarpal joint. There is widening of scapholunate joint space suspicious for ligamental injury. Mild chondrocalcinosis. Degenerative changes are noted anterior articular surface of scaphoid. IMPRESSION: No acute fracture. Mild narrowing of radiocarpal joint space. Degenerative changes noted first carpometacarpal joint. There is widening of scapholunate joint space suspicious for ligamental injury. Electronically Signed   By: Natasha Mead M.D.   On: 09/11/2016 11:13    EKG: Independently reviewed. Atrial fibrillation, LVH  Assessment/Plan  1. CAP  - Pt presents with fever, leukocytosis, rhonchi, and bibasilar infiltrates on CXR  - Blood and urine cultures obtained, sputum culture and respiratory virus panel ordered  - She was given 30 cc/kg NS at [facility] and started on empiric Rocephin and azithromycin  - Plan to continue current abx while awaiting culture data and following clinical response to therapy   2. Atrial fibrillation with RVR  - Pt denies hx of palpitations, but also denies palpitations now with rate 150s - She was started on diltiazem in October 2017 after having HR in 120s at consecutive PCP appts; chart notes describe HR as regular during those visits, but there were no EKGs - Troponin is undetectable; TSH is pending; potassium low and replaced; mag replaced empirically - Likely precipitated by pneumonia, being addressed as above  - She was given  10 mg IVP diltiazem with no lasting improvement and was started on diltiazem infusion    3. Hyponatremia  - Serum sodium is 133 on admission in setting of apparent dehydration  - She was treated with 30 cc/kg NS in ED  - Repeat chem panel in ED    4. Hypokalemia  - Serum potassium 3.2 on admission  - She was given 40 mEq oral supplement in ED; 1 g magnesium given on admission - Monitoring on telemetry, repeating chem panel in am    5. Wrist sprain, left - Pt fell onto an outstretched left hand on 12/31 and experienced pain and swelling - Radiographs negative for fracture, but widened scapholunate space suggests ligamentous injury  - She had wrist splint applied in ED; continue prn analgesia    DVT prophylaxis: sq Lovenox  Code Status: Full  Family Communication: Son updated at bedside Disposition Plan: Admit to stepdown Consults  called: None Admission status: Inpatient    Briscoe Deutscher, MD [facility] Pager [phone]  If 7PM-7AM, please contact night-coverage [email or web address] Password [email or web address]  09/11/2016, 7:07 PM

## 2016-09-11 NOTE — ED Provider Notes (Signed)
[facility] Provider Note   CSN: [id number] Arrival date & time: 09/11/16  1019     History   Chief Complaint No chief complaint on file.   HPI Savannah Gutierrez is a 83 y.o. female.  HPI   83 year old female with history of bursitis of hip, memory loss, GERD brought here by son for evaluation of cough. For the past 3 days, patient has had fever, chills, generalized weakness, cough, nasal congestion, and sneezing. Symptoms has gotten progressively worse. She has been taking Delsym which makes her increasingly more groggy according to son. She was recently exposed to sick contact. 3 days ago was getting on the bus, loss of balance and fell forward. She injured her left wrist trying to block the fall. She did not hit her head or loss of consciousness. She has been having sharp throbbing pain to her left wrist worsening with movement. She is having difficulty changing her clothes due to the pain. She denies any hand, elbow or shoulder pain. She denies any precipitating symptoms prior to fall. She normally walks with a walker. She lives at a retirement facility. She denies any worsening shortness of breath, nausea vomiting or diarrhea, abdominal pain or back pain. No recent hospitalization.  Past Medical History:  Diagnosis Date  . Anxiety   . Bronchitis   . Bursitis of hip   . Depression   . GERD (gastroesophageal reflux disease)   . Hypercholesteremia   . Hypertension   . Memory loss   . Shortness of breath     There are no active problems to display for this patient.   Past Surgical History:  Procedure Laterality Date  . CHOLECYSTECTOMY    . KNEE SURGERY      OB History    No data available       Home Medications    Prior to Admission medications   Medication Sig Start Date End Date Taking? Authorizing Provider  Ascorbic Acid (VITAMIN C) 100 MG tablet Take 100 mg by mouth daily.    Historical Provider, MD  aspirin 81 MG tablet Take 81 mg by mouth daily.     Historical Provider, MD  atorvastatin (LIPITOR) 10 MG tablet Take 10 mg by mouth daily.    Historical Provider, MD  cholecalciferol (VITAMIN D) 1000 UNITS tablet Take 1,000 Units by mouth daily.    Historical Provider, MD  diltiazem (CARDIZEM) 120 MG tablet Take 180 mg by mouth 4 (four) times daily.    Historical Provider, MD  Multiple Vitamins-Minerals (OSTEO COMPLEX PO) Take by mouth.    Historical Provider, MD  venlafaxine (EFFEXOR) 75 MG tablet Take 75 mg by mouth 2 (two) times daily.    Historical Provider, MD  vitamin B-12 (CYANOCOBALAMIN) 100 MCG tablet Take 100 mcg by mouth daily.    Historical Provider, MD    Family History No family history on file.  Social History Social History  Substance Use Topics  . Smoking status: Former Games developermoker  . Smokeless tobacco: Not on file  . Alcohol use No     Allergies   Patient has no known allergies.   Review of Systems Review of Systems  All other systems reviewed and are negative.    Physical Exam Updated Vital Signs There were no vitals taken for this visit.  Physical Exam  Constitutional: No distress.  Frail-appearing elderly female, with persistent cough, laying in bed  HENT:  Head: Atraumatic.  Right Ear: External ear normal.  Left Ear: External ear normal.  Mouth/Throat: Oropharynx is clear and moist.  Rhinorrhea  Eyes: Conjunctivae are normal.  Neck: Normal range of motion. Neck supple.  Cardiovascular:  Irregularly irregular heart rhythm with systolic heart murmur  Pulmonary/Chest:  Crackles heard at the lung base without wheezes or rhonchi  Abdominal: Soft. There is no tenderness.  Musculoskeletal: She exhibits tenderness (Left wrist: Wrist is moderately edematous, with tenderness and decreased flexion and extension supination and pronation secondary to pain. No crepitus. Radial pulse 2+. Left hand and left elbow are nontender.). She exhibits no edema.  Lymphadenopathy:    She has no cervical adenopathy.    Neurological: She is alert.  Skin: No rash noted.  Psychiatric: She has a normal mood and affect.  Nursing note and vitals reviewed.    ED Treatments / Results  Labs (all labs ordered are listed, but only abnormal results are displayed) Labs Reviewed  COMPREHENSIVE METABOLIC PANEL - Abnormal; Notable for the following:       Result Value   Sodium 133 (*)    Potassium 3.2 (*)    Chloride 100 (*)    Glucose, Bld 109 (*)    Calcium 8.8 (*)    Albumin 3.3 (*)    Total Bilirubin 1.6 (*)    All other components within normal limits  CBC WITH DIFFERENTIAL/PLATELET - Abnormal; Notable for the following:    WBC 13.3 (*)    Neutro Abs 12.2 (*)    Lymphs Abs 0.3 (*)    All other components within normal limits  I-STAT CG4 LACTIC ACID, ED - Abnormal; Notable for the following:    Lactic Acid, Venous 2.08 (*)    All other components within normal limits  CULTURE, BLOOD (ROUTINE X 2)  CULTURE, BLOOD (ROUTINE X 2)  URINE CULTURE  RESPIRATORY PANEL BY PCR  TROPONIN I  URINALYSIS, ROUTINE W REFLEX MICROSCOPIC    EKG  EKG Interpretation  Date/Time:  Tuesday September 11 2016 11:29:23 EST Ventricular Rate:  126 PR Interval:    QRS Duration: 80 QT Interval:  328 QTC Calculation: 446 R Axis:   -37 Text Interpretation:  Atrial fibrillation Left ventricular hypertrophy Inferior infarct, old Probable anterior infarct, age indeterminate Confirmed by BELFI  MD, MELANIE ([id number]) on 09/11/2016 11:50:50 AM       Radiology Dg Chest 2 View  Result Date: 09/11/2016 CLINICAL DATA:  Cough and congestion for 3 days. Progressive weakness. EXAM: CHEST  2 VIEW COMPARISON:  Two-view chest x-ray 07/12/2016 FINDINGS: The heart size is exaggerated by low lung volumes. Atherosclerotic calcifications are again noted in the aorta. Bibasilar airspace disease is superimposed on chronic interstitial disease bilaterally. There is no significant edema. No definite effusions are present. IMPRESSION: 1. Bibasilar  airspace disease concerning for pneumonia superimposed on chronic interstitial lung disease. 2. Emphysema. 3. Aortic atherosclerosis. Electronically Signed   By: Marin Roberts M.D.   On: 09/11/2016 11:14   Dg Wrist Complete Left  Result Date: 09/11/2016 CLINICAL DATA:  Fall 2 days ago, left wrist pain EXAM: LEFT WRIST - COMPLETE 3+ VIEW COMPARISON:  None. FINDINGS: Four views of the left wrist submitted. No acute fracture. Mild narrowing of radiocarpal joint space. Degenerative changes noted first carpometacarpal joint. There is widening of scapholunate joint space suspicious for ligamental injury. Mild chondrocalcinosis. Degenerative changes are noted anterior articular surface of scaphoid. IMPRESSION: No acute fracture. Mild narrowing of radiocarpal joint space. Degenerative changes noted first carpometacarpal joint. There is widening of scapholunate joint space suspicious for ligamental injury. Electronically Signed  By: Natasha Mead M.D.   On: 09/11/2016 11:13    Procedures Procedures (including critical care time)  Medications Ordered in ED Medications  sodium chloride 0.9 % bolus 1,000 mL (1,000 mLs Intravenous New Bag/Given 09/11/16 1136)    And  sodium chloride 0.9 % bolus 500 mL (not administered)    And  sodium chloride 0.9 % bolus 250 mL (not administered)  azithromycin (ZITHROMAX) 500 mg in dextrose 5 % 250 mL IVPB (not administered)  azithromycin (ZITHROMAX) 500 mg in dextrose 5 % 250 mL IVPB (not administered)  cefTRIAXone (ROCEPHIN) 1 g in dextrose 5 % 50 mL IVPB (not administered)  potassium chloride SA (K-DUR,KLOR-CON) CR tablet 40 mEq (not administered)  azithromycin (ZITHROMAX) 500 MG injection (not administered)  acetaminophen (TYLENOL) tablet 650 mg (650 mg Oral Given 09/11/16 1134)  cefTRIAXone (ROCEPHIN) 1 g in dextrose 5 % 50 mL IVPB (1 g Intravenous New Bag/Given 09/11/16 1156)     Initial Impression / Assessment and Plan / ED Course  I have reviewed the triage  vital signs and the nursing notes.  Pertinent labs & imaging results that were available during my care of the patient were reviewed by me and considered in my medical decision making (see chart for details).  Clinical Course     BP (!) 117/54 (BP Location: Right Arm)   Pulse 103   Temp 101.9 F (38.8 C) (Rectal)   Resp 24   Ht 5' (1.524 m)   Wt 52.6 kg   SpO2 96%   BMI 22.65 kg/m    Final Clinical Impressions(s) / ED Diagnoses   Final diagnoses:  Community acquired pneumonia, unspecified laterality  Sepsis, due to unspecified organism (HCC)  Sprain of left wrist, initial encounter    New Prescriptions New Prescriptions   No medications on file   11:05 AM Elderly female here with cough, generalized weakness, concerning for pulmonary infection such as pneumonia or flulike symptoms. She does have an elevated temperature of 101.9 and is tachycardic and tachypneic. Tylenol given along with IV fluid and broad-spectrum antibiotic and. Code sepsis initiated.  Care discussed with Dr. Fredderick Phenix.   12:34 PM EKG confirms that patient has A. fib. Son reported patient has history of A. fib in the past without any specific treatment. Labs remarkable for an elevated lactic acid of 2.08, elevated WBC of 13.3, chest x-ray demonstrate bibasilar air space disease concerning for pneumonia. Left wrist x-ray without acute fracture but has some findings suggestive of a ligament injury consistence with a wrist sprain. Wrist brace and ICE applied. Patient started on Rocephin and Zithromax antibiotic. IV fluid given at a rate of 30 ML per kilogram. On reassessment her tachycardia has improved.  Appreciate consultation from triad hospitalist, Dr. Konrad Dolores from [facility], who agrees to admit pt to step down, under his care.  Pt will be transfer to [facility] for further management.  Care discussed with Dr. Fredderick Phenix  Sepsis - Repeat Assessment  Performed at:    12:25 PM  Vitals     Blood pressure (!) 117/54,  pulse 103, temperature 101.9 F (38.8 C), temperature source Rectal, resp. rate 24, height 5' (1.524 m), weight 52.6 kg, SpO2 96 %.  Heart:     Irregular rate and rhythm  Lungs:    Rales  Capillary Refill:   <2 sec  Peripheral Pulse:   Radial pulse palpable  Skin:     warm  CRITICAL CARE Performed by: Mykenzie Ebanks Total critical care time: 35 minutes Critical  care time was exclusive of separately billable procedures and treating other patients. Critical care was necessary to treat or prevent imminent or life-threatening deterioration. Critical care was time spent personally by me on the following activities: development of treatment plan with patient and/or surrogate as well as nursing, discussions with consultants, evaluation of patient's response to treatment, examination of patient, obtaining history from patient or surrogate, ordering and performing treatments and interventions, ordering and review of laboratory studies, ordering and review of radiographic studies, pulse oximetry and re-evaluation of patient's condition.     Fayrene Helper, PA-C 09/11/16 1238    Rolan Bucco, MD 09/11/16 [phone]

## 2016-09-11 NOTE — ED Notes (Signed)
Report [facility]ia [facility].

## 2016-09-11 NOTE — Progress Notes (Signed)
[facility] and mechanical fall resulting in a sprained wrist. [facility]. Accepted to [facility] services.   Shelly Flattenavid Josyah Achor, MD [facility] 09/11/2016, 12:32 PM

## 2016-09-12 ENCOUNTER — Encounter (HOSPITAL_COMMUNITY): Payer: Self-pay | Admitting: Physician Assistant

## 2016-09-12 ENCOUNTER — Inpatient Hospital Stay (HOSPITAL_COMMUNITY): Payer: Medicare Other

## 2016-09-12 DIAGNOSIS — S63502A Unspecified sprain of left wrist, initial encounter: Secondary | ICD-10-CM

## 2016-09-12 DIAGNOSIS — I4891 Unspecified atrial fibrillation: Secondary | ICD-10-CM

## 2016-09-12 DIAGNOSIS — J189 Pneumonia, unspecified organism: Secondary | ICD-10-CM

## 2016-09-12 LAB — BASIC METABOLIC PANEL
Anion gap: 7 (ref 5–15)
BUN: 5 mg/dL — AB (ref 6–20)
CHLORIDE: 108 mmol/L (ref 101–111)
CO2: 23 mmol/L (ref 22–32)
Calcium: 8.4 mg/dL — ABNORMAL LOW (ref 8.9–10.3)
Creatinine, Ser: 0.56 mg/dL (ref 0.44–1.00)
GFR calc non Af Amer: >60 mL/min (ref >60)
Glucose, Bld: 121 mg/dL — ABNORMAL HIGH (ref 65–99)
POTASSIUM: 3.6 mmol/L (ref 3.5–5.1)
SODIUM: 138 mmol/L (ref 135–145)

## 2016-09-12 LAB — RESPIRATORY PANEL BY PCR
ADENOVIRUS-RVPPCR: NOT DETECTED
Bordetella pertussis: NOT DETECTED
CORONAVIRUS HKU1-RVPPCR: NOT DETECTED
CORONAVIRUS NL63-RVPPCR: NOT DETECTED
CORONAVIRUS OC43-RVPPCR: NOT DETECTED
Chlamydophila pneumoniae: NOT DETECTED
Coronavirus 229E: NOT DETECTED
INFLUENZA A-RVPPCR: NOT DETECTED
Influenza B: NOT DETECTED
MYCOPLASMA PNEUMONIAE-RVPPCR: NOT DETECTED
Metapneumovirus: NOT DETECTED
PARAINFLUENZA VIRUS 1-RVPPCR: NOT DETECTED
PARAINFLUENZA VIRUS 3-RVPPCR: NOT DETECTED
PARAINFLUENZA VIRUS 4-RVPPCR: NOT DETECTED
Parainfluenza Virus 2: NOT DETECTED
RHINOVIRUS / ENTEROVIRUS - RVPPCR: NOT DETECTED
Respiratory Syncytial Virus: NOT DETECTED

## 2016-09-12 LAB — MAGNESIUM: Magnesium: 2 mg/dL (ref 1.7–2.4)

## 2016-09-12 LAB — ECHOCARDIOGRAM COMPLETE
HEIGHTINCHES: 60 in
WEIGHTICAEL: 1868.8 [oz_av]

## 2016-09-12 LAB — HIV ANTIBODY (ROUTINE TESTING W REFLEX): HIV Screen 4th Generation wRfx: NONREACTIVE

## 2016-09-12 LAB — MRSA PCR SCREENING: MRSA by PCR: NEGATIVE

## 2016-09-12 LAB — TSH: TSH: 2.903 u[IU]/mL (ref 0.350–4.500)

## 2016-09-12 MED ORDER — HYDROCOD POLST-CPM POLST ER 10-8 MG/5ML PO SUER
5.0000 mL | Freq: Two times a day (BID) | ORAL | Status: DC
  Administered 2016-09-12 (×2): 5 mL via ORAL
  Filled 2016-09-12 (×3): qty 5

## 2016-09-12 NOTE — Evaluation (Signed)
Occupational Therapy Evaluation Patient Details Name: Savannah Gutierrez MRN: [id number] DOB: 04-04-1925 Today's Date: 09/12/2016    History of Present Illness 83 y.o. female admitted with weakness, cough, dyspnea x 3 days, she also had a fall onto L wrist. Dx of PNA, L wrist sprain, a fib, hypokalemia, hyponatremia.    Clinical Impression   Pt currently completes selfcare tasks at a min assist level secondary to decreased standing balance.  LOB X 2 when standing at the sink and performing grooming tasks this session.  Feel she will benefit from acute care OT as well as follow-up SNF to continue progression back to modified independent level.  Pt's son and daughter present for eval as well and agree for pt to continue further rehab.  Pt lives at [facility] and would like to transition from [facility] to SNF part of of [place] if possible for rehab.     Follow Up Recommendations  Supervision/Assistance - 24 hour;SNF    Equipment Recommendations  Other (comment) (TBD next venue of care if going to SNF.  )       Precautions / Restrictions Precautions Precautions: Fall Precaution Comments: pt/family report 1 fall in past year (in addition to fall just prior to admission) Required Braces or Orthoses: Other Brace/Splint Other Brace/Splint: L wrist splint Restrictions Weight Bearing Restrictions: No      Mobility Bed Mobility Overal bed mobility: Needs Assistance Bed Mobility: Sit to Supine     Supine to sit: Min assist Sit to supine: Min guard   General bed mobility comments: HOB up 35*, min A with pad to scoot to EOB  Transfers Overall transfer level: Needs assistance Equipment used: Rolling walker (2 wheeled) Transfers: Sit to/from [employer] Sit to Stand: Min assist Stand pivot transfers: Min assist       General transfer comment: min A to rise and steady during stand pivot transfer, mild LOB requiring min A, VCs hand placement    Balance Overall  balance assessment: Needs assistance;History of Falls Sitting-balance support: No upper extremity supported Sitting balance-Leahy Scale: Good     Standing balance support: No upper extremity supported Standing balance-Leahy Scale: Poor Standing balance comment: LOB posteriorly when standing to complete grooming tasks.                              ADL Overall ADL's : Needs assistance/impaired Eating/Feeding: Set up;Sitting   Grooming: Oral care;Brushing hair;Standing;Minimal assistance   Upper Body Bathing: Supervision/ safety;Sitting   Lower Body Bathing: Minimal assistance;Sit to/from stand       Lower Body Dressing: Minimal assistance;Sit to/from stand   Toilet Transfer: Minimal assistance;Ambulation;RW   Toileting- Clothing Manipulation and Hygiene: Minimal assistance;Sit to/from stand       Functional mobility during ADLs: Minimal assistance;Rolling walker General ADL Comments: Pt with son and daugther present during evaluation and assisted with providing PLOF information.  She was able to ambulate with use of the RW for safety and support with min assist.  LOB X 2 when standing at the sink and participating in grooming tasks of brushing her hair and her teeth.       Vision Vision Assessment?: No apparent visual deficits   Perception Perception Perception Tested?: No   Praxis Praxis Praxis tested?: Not tested    Pertinent Vitals/Pain Pain Assessment: No/denies pain     Hand Dominance  right   Extremity/Trunk Assessment Upper Extremity Assessment Upper Extremity Assessment: LUE deficits/detail LUE  Deficits / Details: Pt with limitations in left wrist flexion/extension and digit extension.  Wrist extension 0-15 degrees estimated and flexion 0-60 degrees.  Digit extension 80% of full AROM All other joints AROM WFLS with strength in the elbow, shoulder, and gross grasp at 4/5.     Lower Extremity Assessment Lower Extremity Assessment: Defer to PT  evaluation   Cervical / Trunk Assessment Cervical / Trunk Assessment: Normal   Communication Communication Communication: HOH   Cognition Arousal/Alertness: Lethargic Behavior During Therapy: WFL for tasks assessed/performed Overall Cognitive Status: Impaired/Different from baseline Area of Impairment: Memory               General Comments: Pt needed mod questioning cueing for orientation to place.               Home Living Family/patient expects to be discharged to:: Private residence (lives in ILF) Living Arrangements: Alone Available Help at Discharge: Family Type of Home: Independent living facility Home Access: Level entry     Home Layout: One level     Bathroom Shower/Tub: Producer, television/film/videoWalk-in shower   Bathroom Toilet: Standard Bathroom Accessibility: Yes   Home Equipment: Environmental consultantWalker - 2 wheels;Grab bars - toilet;Grab bars - tub/shower          Prior Functioning/Environment Level of Independence: Independent with assistive device(s)        Comments: takes a shuttle to dining room which is in a different building at her ILF; walks with RW, independent bathing/dressing        OT Problem List: Decreased strength;Decreased activity tolerance;Impaired balance (sitting and/or standing);Decreased knowledge of use of DME or AE   OT Treatment/Interventions: Self-care/ADL training;DME and/or AE instruction;Energy conservation;Therapeutic activities;Balance training;Patient/family education    OT Goals(Current goals can be found in the care plan section) Acute Rehab OT Goals Patient Stated Goal: Pt did not state this visit.  OT Goal Formulation: With patient/family Time For Goal Achievement: 09/26/16 Potential to Achieve Goals: Good  OT Frequency: Min 2X/week              End of Session Equipment Utilized During Treatment: Rolling walker Nurse Communication: Mobility status  Activity Tolerance: Patient tolerated treatment well Patient left: in bed;with  family/visitor present   Time: 1457-1540 OT Time Calculation (min): 43 min Charges:  OT General Charges $OT Visit: 1 Procedure OT Evaluation $OT Eval Moderate Complexity: 1 Procedure OT Treatments $Self Care/Home Management : 23-37 mins  Callia Swim OTR/L 09/12/2016, 3:51 PM

## 2016-09-12 NOTE — Evaluation (Signed)
Physical Therapy Evaluation Patient Details Name: Savannah Gutierrez MRN: [id number] DOB: 30-Aug-1925 Today's Date: 09/12/2016   History of Present Illness  83 y.o. female admitted with weakness, cough, dyspnea x 3 days, she also had a fall onto L wrist. Dx of PNA, L wrist sprain, a fib, hypokalemia, hyponatremia.   Clinical Impression  Pt admitted with above diagnosis. Pt currently with functional limitations due to the deficits listed below (see PT Problem List). Pt ambulated 120' with RW, HR 114 max, SaO2 94% on RA, min A for loss of balance x 3.  Pt will benefit from skilled PT to increase their independence and safety with mobility to allow discharge to the venue listed below.       Follow Up Recommendations SNF;Other (comment) (assist for mobility)    Equipment Recommendations  None recommended by PT    Recommendations for Other Services OT consult     Precautions / Restrictions Precautions Precautions: Fall Precaution Comments: pt/family report 1 fall in past year (in addition to fall just prior to admission) Required Braces or Orthoses: Other Brace/Splint Other Brace/Splint: L wrist splint Restrictions Weight Bearing Restrictions: No      Mobility  Bed Mobility Overal bed mobility: Needs Assistance Bed Mobility: Supine to Sit     Supine to sit: Min assist     General bed mobility comments: HOB up 35*, min A with pad to scoot to EOB  Transfers Overall transfer level: Needs assistance Equipment used: Rolling walker (2 wheeled) Transfers: Sit to/from [employer] Sit to Stand: Min assist Stand pivot transfers: Min assist       General transfer comment: min A to rise and steady during stand pivot transfer, mild LOB requiring min A, VCs hand placement  Ambulation/Gait Ambulation/Gait assistance: Min assist Ambulation Distance (Feet): 120 Feet Assistive device: Rolling walker (2 wheeled) Gait Pattern/deviations: Step-through pattern;Decreased stride  length   Gait velocity interpretation: at or above normal speed for age/gender General Gait Details: min A for mild LOB x 3 during ambulation, SaO2 94% on RA walking, HR 114 max  Stairs            Wheelchair Mobility    Modified Rankin (Stroke Patients Only)       Balance Overall balance assessment: Needs assistance;History of Falls Sitting-balance support: Bilateral upper extremity supported Sitting balance-Leahy Scale: Good       Standing balance-Leahy Scale: Poor                               Pertinent Vitals/Pain Pain Assessment: No/denies pain    Home Living Family/patient expects to be discharged to:: Private residence (lives in ILF) Living Arrangements: Alone Available Help at Discharge: Family Type of Home: Independent living facility Home Access: Level entry     Home Layout: One level Home Equipment: Environmental consultant - 2 wheels      Prior Function Level of Independence: Independent with assistive device(s)         Comments: takes a shuttle to dining room which is in a different building at her ILF; walks with RW, independent bathing/dressing     Hand Dominance        Extremity/Trunk Assessment   Upper Extremity Assessment Upper Extremity Assessment: Defer to OT evaluation (L wrist in splint, able to weightbear on walker with LUE, denied pain)    Lower Extremity Assessment Lower Extremity Assessment: Overall WFL for tasks assessed    Cervical / Trunk Assessment  Cervical / Trunk Assessment: Normal  Communication   Communication: HOH  Cognition Arousal/Alertness: Awake/alert Behavior During Therapy: WFL for tasks assessed/performed Overall Cognitive Status: Within Functional Limits for tasks assessed                      General Comments      Exercises     Assessment/Plan    PT Assessment Patient needs continued PT services  PT Problem List Decreased balance;Decreased mobility;Decreased safety awareness           PT Treatment Interventions Gait training;Functional mobility training;Balance training;Therapeutic exercise;Therapeutic activities;Patient/family education    PT Goals (Current goals can be found in the Care Plan section)  Acute Rehab PT Goals Patient Stated Goal: return to independence PT Goal Formulation: With patient/family Time For Goal Achievement: 09/26/16 Potential to Achieve Goals: Good    Frequency Min 3X/week   Barriers to discharge Decreased caregiver support      Co-evaluation               End of Session Equipment Utilized During Treatment: Gait belt Activity Tolerance: Patient tolerated treatment well Patient left: in chair;with call bell/phone within reach;with family/visitor present Nurse Communication: Mobility status         Time: 1359-1429 PT Time Calculation (min) (ACUTE ONLY): 30 min   Charges:   PT Evaluation $PT Eval Low Complexity: 1 Procedure PT Treatments $Gait Training: 8-22 mins   PT G Codes:        Tamala SerUhlenberg, Trinaty Bundrick Kistler 09/12/2016, 2:51 PM [phone]

## 2016-09-12 NOTE — Progress Notes (Signed)
[facility]                                                                              Patient Demographics  Savannah Gutierrez, is a 83 y.o. female, DOB - 07/07/1925, ZOX:[id number]  Admit date - 09/11/2016   Admitting Physician Briscoe Deutscher, MD  Outpatient Primary MD for the patient is No PCP Per Patient  Outpatient specialists:   LOS - 1  days    Chief Complaint  Patient presents with  . Cough       Brief summary   83 year old female with hypertension, recent diagnosis for tachycardia with a new prescription for Cardizem presented with generalized weakness cough, dyspnea. She also had a recent fall on 12/31 on her outstretched left hand with pain and swelling of the left wrist. X-ray of the left wrist negative for fracture. Chest x-ray showed emphysema with bibasilar airspace disease concerning for pneumonia. Patient was found to be tachypneic and 30s, and tachycardia with heart rate in 120s. EKG showed atrial fibrillation with RVR.   Assessment & Plan    Community-acquired pneumonia - Pt presented with fever, leukocytosis, rhonchi, and bibasilar infiltrates on CXR  - Follow blood cultures, HIV negative, follow influenza panel, respiratory virus panel - Continue IV Rocephin, Zithromax   Atrial fibrillation with RVR  - Pt denies hx of palpitations, but also denies palpitations now with rate 150s. She was started on diltiazem in October 2017 - Troponin negative, TSH 2.9  - Currently, on Cardizem drip, heart rate still not controlled, increase rate to 10mg /hr - Discussed with patient and son at the bedside, regarding stroke risk with atrial fibrillation. She lives in an independent facility however had recent fall on 12/31. No prior history of GI bleed. Patient and her son is apprehensive of being on anticoagulation however he wants his sister to make the decision was to have the power of attorney. - I have also consulted cardiology  - 2-D echo pending   Hyponatremia  - Serum sodium is 133 on admission in setting of apparent dehydration  - improving    Hypokalemia  - Serum potassium 3.2 on admission,and received replacement     Wrist sprain, left - Pt fell onto an outstretched left hand on 12/31 and experienced pain and swelling - Radiographs negative for fracture, but widened scapholunate space suggests ligamentous injury  - She had wrist splint applied in ED; continue prn analgesia  - PTOT evaluation, may need higher level of care   Code Status:Full code DVT Prophylaxis:  Lovenox  Family Communication: Discussed in detail with the patient, all imaging results, lab results explained to the patient and son   Disposition Plan:   Time Spent in minutes   25 minutes  Procedures:    Consultants:   Cardiology   Antimicrobials:   IV Zithromax  IV Rocephin    Medications  Scheduled Meds: . aspirin EC  81 mg Oral Daily  . azithromycin  500 mg Intravenous Q24H  . cefTRIAXone (ROCEPHIN)  IV  1 g Intravenous Q24H  . chlorpheniramine-HYDROcodone  5 mL Oral Q12H  . cholecalciferol  1,000 Units Oral Daily  .  enoxaparin (LOVENOX) injection  40 mg Subcutaneous Q24H  . venlafaxine  75 mg Oral BID  . vitamin B-12  100 mcg Oral Daily   Continuous Infusions: . diltiazem (CARDIZEM) infusion 10 mg/hr (09/12/16 1148)   PRN Meds:.acetaminophen   Antibiotics   Anti-infectives    Start     Dose/Rate Route Frequency Ordered Stop   09/12/16 1200  azithromycin (ZITHROMAX) 500 mg in dextrose 5 % 250 mL IVPB     500 mg 250 mL/hr over 60 Minutes Intravenous Every 24 hours 09/11/16 1125     09/12/16 1200  cefTRIAXone (ROCEPHIN) 1 g in dextrose 5 % 50 mL IVPB     1 g 100 mL/hr over 30 Minutes Intravenous Every 24 hours 09/11/16 1125     09/11/16 1229  azithromycin (ZITHROMAX) 500 MG injection    Comments:  Reich, Jerrye Beavers   : cabinet override      09/11/16 1229 09/12/16 0044   09/11/16 1115  cefTRIAXone (ROCEPHIN) 1 g in dextrose 5 % 50  mL IVPB     1 g 100 mL/hr over 30 Minutes Intravenous  Once 09/11/16 1101 09/11/16 1230   09/11/16 1115  azithromycin (ZITHROMAX) 500 mg in dextrose 5 % 250 mL IVPB     500 mg 250 mL/hr over 60 Minutes Intravenous  Once 09/11/16 1101 09/11/16 1336        Subjective:   Savannah Gutierrez was seen and examined today.Cough improving after tussionex. No fevers or chills. Heart rate still somewhat elevated. No chest pain  Patient denies dizziness, shortness of breath, abdominal pain, N/V/D/C, new weakness, numbess, tingling. No acute events overnight.    Objective:   Vitals:   09/12/16 0241 09/12/16 0448 09/12/16 0752 09/12/16 1100  BP: 118/68 112/75 125/70 109/69  Pulse: (!) 108 (!) 108 (!) 101 93  Resp: (!) 26 (!) 24 (!) 23 (!) 28  Temp:  98.1 F (36.7 C) 97.7 F (36.5 C) 97.5 F (36.4 C)  TempSrc:  Oral Oral Oral  SpO2: 91% 92% 93% 100%  Weight:      Height:        Intake/Output Summary (Last 24 hours) at 09/12/16 1358 Last data filed at 09/12/16 0900  Gross per 24 hour  Intake          1168.33 ml  Output                0 ml  Net          1168.33 ml     Wt Readings from Last 3 Encounters:  09/11/16 53 kg (116 lb 12.8 oz)  11/11/13 56.7 kg (125 lb)     Exam  General: Alert and oriented x 3, NAD  HEENT:  PERRLA, EOMI, Anicteric Sclera, mucous membranes moist.   Neck: Supple, no JVD, no masses  Cardiovascular: S1 S2 auscultated, ireg, ireg  Respiratory: Scattered rhonchi bilaterally   Gastrointestinal: Soft, nontender, nondistended, + bowel sounds  Ext: no cyanosis clubbing or edema  Neuro: AAOx3, Cr N's II- XII. Strength 5/5 upper and lower extremities bilaterally  Skin: No rashes  Psych: Normal affect and demeanor, alert and oriented x3    Data Reviewed:  I have personally reviewed following labs and imaging studies  Micro Results Recent Results (from the past 240 hour(s))  Respiratory Panel by PCR     Status: None   Collection Time: 09/11/16 11:40  AM  Result Value Ref Range Status   Adenovirus NOT DETECTED NOT DETECTED Final   Coronavirus  229E NOT DETECTED NOT DETECTED Final   Coronavirus HKU1 NOT DETECTED NOT DETECTED Final   Coronavirus NL63 NOT DETECTED NOT DETECTED Final   Coronavirus OC43 NOT DETECTED NOT DETECTED Final   Metapneumovirus NOT DETECTED NOT DETECTED Final   Rhinovirus / Enterovirus NOT DETECTED NOT DETECTED Final   Influenza A NOT DETECTED NOT DETECTED Final   Influenza B NOT DETECTED NOT DETECTED Final   Parainfluenza Virus 1 NOT DETECTED NOT DETECTED Final   Parainfluenza Virus 2 NOT DETECTED NOT DETECTED Final   Parainfluenza Virus 3 NOT DETECTED NOT DETECTED Final   Parainfluenza Virus 4 NOT DETECTED NOT DETECTED Final   Respiratory Syncytial Virus NOT DETECTED NOT DETECTED Final   Bordetella pertussis NOT DETECTED NOT DETECTED Final   Chlamydophila pneumoniae NOT DETECTED NOT DETECTED Final   Mycoplasma pneumoniae NOT DETECTED NOT DETECTED Final    Comment: Performed at [facility]  MRSA PCR Screening     Status: None   Collection Time: 09/11/16  6:00 PM  Result Value Ref Range Status   MRSA by PCR NEGATIVE NEGATIVE Final    Comment:        The GeneXpert MRSA Assay (FDA approved for NASAL specimens only), is one component of a comprehensive MRSA colonization surveillance program. It is not intended to diagnose MRSA infection nor to guide or monitor treatment for MRSA infections.     Radiology Reports Dg Chest 2 View  Result Date: 09/11/2016 CLINICAL DATA:  Cough and congestion for 3 days. Progressive weakness. EXAM: CHEST  2 VIEW COMPARISON:  Two-view chest x-ray 07/12/2016 FINDINGS: The heart size is exaggerated by low lung volumes. Atherosclerotic calcifications are again noted in the aorta. Bibasilar airspace disease is superimposed on chronic interstitial disease bilaterally. There is no significant edema. No definite effusions are present. IMPRESSION: 1. Bibasilar airspace disease  concerning for pneumonia superimposed on chronic interstitial lung disease. 2. Emphysema. 3. Aortic atherosclerosis. Electronically Signed   By: Marin Roberts M.D.   On: 09/11/2016 11:14   Dg Wrist Complete Left  Result Date: 09/11/2016 CLINICAL DATA:  Fall 2 days ago, left wrist pain EXAM: LEFT WRIST - COMPLETE 3+ VIEW COMPARISON:  None. FINDINGS: Four views of the left wrist submitted. No acute fracture. Mild narrowing of radiocarpal joint space. Degenerative changes noted first carpometacarpal joint. There is widening of scapholunate joint space suspicious for ligamental injury. Mild chondrocalcinosis. Degenerative changes are noted anterior articular surface of scaphoid. IMPRESSION: No acute fracture. Mild narrowing of radiocarpal joint space. Degenerative changes noted first carpometacarpal joint. There is widening of scapholunate joint space suspicious for ligamental injury. Electronically Signed   By: Natasha Mead M.D.   On: 09/11/2016 11:13    Lab Data:  CBC:  Recent Labs Lab 09/11/16 1120  WBC 13.3*  NEUTROABS 12.2*  HGB 12.5  HCT 38.6  MCV 89.4  PLT 241   Basic Metabolic Panel:  Recent Labs Lab 09/11/16 1120 09/12/16 0238  NA 133* 138  K 3.2* 3.6  CL 100* 108  CO2 22 23  GLUCOSE 109* 121*  BUN 11 5*  CREATININE 0.62 0.56  CALCIUM 8.8* 8.4*  MG  --  2.0   GFR: Estimated Creatinine Clearance: 34.2 mL/min (by C-G formula based on SCr of 0.56 mg/dL). Liver Function Tests:  Recent Labs Lab 09/11/16 1120  AST 30  ALT 17  ALKPHOS 98  BILITOT 1.6*  PROT 7.0  ALBUMIN 3.3*   No results for input(s): LIPASE, AMYLASE in the last 168 hours. No results  for input(s): AMMONIA in the last 168 hours. Coagulation Profile: No results for input(s): INR, PROTIME in the last 168 hours. Cardiac Enzymes:  Recent Labs Lab 09/11/16 1120  TROPONINI <0.03   BNP (last 3 results) No results for input(s): PROBNP in the last 8760 hours. HbA1C: No results for input(s):  HGBA1C in the last 72 hours. CBG: No results for input(s): GLUCAP in the last 168 hours. Lipid Profile: No results for input(s): CHOL, HDL, LDLCALC, TRIG, CHOLHDL, LDLDIRECT in the last 72 hours. Thyroid Function Tests:  Recent Labs  09/12/16 0238  TSH 2.903   Anemia Panel: No results for input(s): VITAMINB12, FOLATE, FERRITIN, TIBC, IRON, RETICCTPCT in the last 72 hours. Urine analysis:    Component Value Date/Time   COLORURINE YELLOW 09/11/2016 1430   APPEARANCEUR CLEAR 09/11/2016 1430   LABSPEC 1.007 09/11/2016 1430   PHURINE 6.0 09/11/2016 1430   GLUCOSEU NEGATIVE 09/11/2016 1430   HGBUR NEGATIVE 09/11/2016 1430   BILIRUBINUR NEGATIVE 09/11/2016 1430   KETONESUR 15 (A) 09/11/2016 1430   PROTEINUR NEGATIVE 09/11/2016 1430   NITRITE NEGATIVE 09/11/2016 1430   LEUKOCYTESUR NEGATIVE 09/11/2016 1430     RAI,RIPUDEEP M.D. [facility] 09/12/2016, 1:58 PM  Pager: [phone] Between 7am to 7pm - call Pager - [phone]  After 7pm go to [email or web address] - password [email or web address]  Call night coverage person covering after 7pm

## 2016-09-12 NOTE — Consult Note (Signed)
CARDIOLOGY CONSULT NOTE   Patient ID: Savannah Gutierrez MRN: [id number], DOB/AGE: 01-21-1925   Admit date: 09/11/2016 Date of Consult: 09/12/2016   Primary Physician: No PCP Per Patient Primary Cardiologist: new - Dr. Anne FuSkains  Referred by Dr. Isidoro Donningai Reason for referral: afib with RVR  Pt. Profile  Savannah Gutierrez is a pleasant 83 year old Caucasian female with past medical history of hypertension, hyperlipidemia and per patient's report, history of paroxysmal atrial fibrillation for many years presented with L wrist injury after fall (negative for Fx on X ray) and PNA, also found to be in afib with RVR on arrival.   Problem List  Past Medical History:  Diagnosis Date  . Anxiety   . Bronchitis   . Bursitis of hip   . Complication of anesthesia    I  HAD DIFFICULTY WAKING ONE TIME "  . Depression   . GERD (gastroesophageal reflux disease)   . Hypercholesteremia   . Hypertension   . Memory loss   . Pneumonia 09/11/2016  . Shortness of breath     Past Surgical History:  Procedure Laterality Date  . CATARACT EXTRACTION    . CHOLECYSTECTOMY    . KNEE SURGERY       Allergies  Allergies  Allergen Reactions  . Tape Rash and Other (See Comments)    Skin is very thin!!    HPI   Savannah Gutierrez is a pleasant 83 year old Caucasian female with past medical history of hypertension, hyperlipidemia and per patient's report, history of paroxysmal atrial fibrillation for many years. I am unable to locate prior documentation of atrial fibrillation. She said it was a Dr. Debroah LoopArnold who diagnosed her initially. She was not placed on systemic anticoagulation. Based on her primary care physician, Dr. Venetia Nighturbyfill's note in the last year, there was no document of any history of atrial fibrillation either. Interestingly, patient was on Cardizem prior to this admission. The dosage of Cardizem was actually increased since October of last year for history of tachycardia. She denies any prior history of stroke.  Her mother did have heart disease later in life, she however she was not aware what type of heart disease. She also has a long-standing history of heart murmur as well. I do not see a echocardiogram, and the patient denies ever seen a cardiologist before either.  Four days ago, she started having mild productive cough. This didn't not resolve. Three days ago, after she was folding her walker and turning her body, she fell forward onto her left hand. [facility] for left wrist swelling and pain and also productive cough. Initial wrist x ray was negative for fracture. She was found to be febrile with MAXIMUM TEMPERATURE 38.8 Celsius. Initial EKG also shows she is in atrial fibrillation with RVR with heart rate 120s. Given fever, mildly elevated white blood cell count and elevated heart rate, she was admitted for sepsis. Chest x-ray showed emphysema with aortic atherosclerosis, bibasilar airspace disease concerning for pneumonia superimposed on chronic interstitial lung disease. Her lactic acid was initially mildly elevated, this has normalized after a few hours. She was treated with IV Rocephin and IV diltiazem. Cardiology has been consulted for atrial fibrillation with RVR.   Inpatient Medications  . aspirin EC  81 mg Oral Daily  . azithromycin  500 mg Intravenous Q24H  . cefTRIAXone (ROCEPHIN)  IV  1 g Intravenous Q24H  . chlorpheniramine-HYDROcodone  5 mL Oral Q12H  . cholecalciferol  1,000 Units  Oral Daily  . enoxaparin (LOVENOX) injection  40 mg Subcutaneous Q24H  . venlafaxine  75 mg Oral BID  . vitamin B-12  100 mcg Oral Daily    Family History Family History  Problem Relation Age of Onset  . Heart disease Mother     unknown type of heart disease later in life     Social History Social History   Social History  . Marital status: Widowed    Spouse name: N/A  . Number of children: N/A  . Years of education: N/A    Occupational History  . Not on file.   Social History Main Topics  . Smoking status: Former Games developer  . Smokeless tobacco: Never Used  . Alcohol use No  . Drug use: No  . Sexual activity: Not on file   Other Topics Concern  . Not on file   Social History Narrative  . No narrative on file     Review of Systems  General:  No chills, fever, night sweats or weight changes.  Cardiovascular:  No chest pain, edema, orthopnea, palpitations, paroxysmal nocturnal dyspnea. +dyspnea on exertion Dermatological: No rash, lesions/masses Respiratory: +cough, dyspnea Urologic: No hematuria, dysuria Abdominal:   No nausea, vomiting, diarrhea, bright red blood per rectum, melena, or hematemesis Neurologic:  No visual changes, wkns, changes in mental status. All other systems reviewed and are otherwise negative except as noted above.  Physical Exam  Blood pressure 109/69, pulse (!) 114, temperature 97.5 F (36.4 C), temperature source Oral, resp. rate (!) 28, height 5' (1.524 m), weight 116 lb 12.8 oz (53 kg), SpO2 94 %.  General: Pleasant, NAD Psych: Normal affect. Neuro: Alert and oriented X 3. Moves all extremities spontaneously. HEENT: Normal  Neck: Supple without bruits or JVD. Lungs:  Resp regular and unlabored. Noticeably diminished breath sound in L base, with crackle in L middle lobe, also has crackle in R lung base. Bilateral rhonchi as well. Heart: irregularly irregular. no s3, s4, or murmurs. Abdomen: Soft, non-tender, non-distended, BS + x 4.  Extremities: No clubbing, cyanosis or edema. DP/PT/Radials 2+ and equal bilaterally.  Labs   Recent Labs  09/11/16 1120  TROPONINI <0.03   Lab Results  Component Value Date   WBC 13.3 (H) 09/11/2016   HGB 12.5 09/11/2016   HCT 38.6 09/11/2016   MCV 89.4 09/11/2016   PLT 241 09/11/2016     Recent Labs Lab 09/11/16 1120 09/12/16 0238  NA 133* 138  K 3.2* 3.6  CL 100* 108  CO2 22 23  BUN 11 5*  CREATININE 0.62 0.56   CALCIUM 8.8* 8.4*  PROT 7.0  --   BILITOT 1.6*  --   ALKPHOS 98  --   ALT 17  --   AST 30  --   GLUCOSE 109* 121*   No results found for: CHOL, HDL, LDLCALC, TRIG No results found for: DDIMER  Radiology/Studies  Dg Chest 2 View  Result Date: 09/11/2016 CLINICAL DATA:  Cough and congestion for 3 days. Progressive weakness. EXAM: CHEST  2 VIEW COMPARISON:  Two-view chest x-ray 07/12/2016 FINDINGS: The heart size is exaggerated by low lung volumes. Atherosclerotic calcifications are again noted in the aorta. Bibasilar airspace disease is superimposed on chronic interstitial disease bilaterally. There is no significant edema. No definite effusions are present. IMPRESSION: 1. Bibasilar airspace disease concerning for pneumonia superimposed on chronic interstitial lung disease. 2. Emphysema. 3. Aortic atherosclerosis. Electronically Signed   By: Marin Roberts M.D.   On: 09/11/2016 11:14  Dg Wrist Complete Left  Result Date: 09/11/2016 CLINICAL DATA:  Fall 2 days ago, left wrist pain EXAM: LEFT WRIST - COMPLETE 3+ VIEW COMPARISON:  None. FINDINGS: Four views of the left wrist submitted. No acute fracture. Mild narrowing of radiocarpal joint space. Degenerative changes noted first carpometacarpal joint. There is widening of scapholunate joint space suspicious for ligamental injury. Mild chondrocalcinosis. Degenerative changes are noted anterior articular surface of scaphoid. IMPRESSION: No acute fracture. Mild narrowing of radiocarpal joint space. Degenerative changes noted first carpometacarpal joint. There is widening of scapholunate joint space suspicious for ligamental injury. Electronically Signed   By: Natasha Mead M.D.   On: 09/11/2016 11:13    ECG  Atrial fibrillation, HR 126   ASSESSMENT AND PLAN  1. Atrial fibrillation with RVR of unknown duration  - Patient denies any chest pain or palpitation, she has no cardiac awareness. According to [facility] health care record, she was recently  seen just yesterday in [facility] internal medicine office, her heart rate at the time was 100.  - Heart rate has significantly improved on IV diltiazem. Will likely transition to oral diltiazem soon. Obtain echocardiogram since patient also has a cardiac murmur. This will also allow [place] to assess left atrial size as well.  - This patients CHA2DS2-VASc Score and unadjusted Ischemic Stroke Rate (% per year) is equal to 7.2 % stroke rate/year from a score of 5  Above score calculated as 1 point each if present [CHF, HTN, DM, Vascular=MI/PAD/Aortic Plaque, Age if 65-74, or Female] Above score calculated as 2 points each if present [Age > 75, or Stroke/TIA/TE]   - This episode of atrial fibrillation with RVR may have occurred in the setting of pneumonia, however due to no cardiac awareness, exact timing of onset is unknown. TSH normal  - Will continue rate control, I have discussed with the patient potential systemic anticoagulation, she feels strongly against oral systemic anticoagulation. She did denies any prior history of stroke and also this is the only time she fell in the last 2 years. She did claim she has some degree of imbalance issue.  - her HR is controlled at this time. Given significant pulmonary issue, will avoid BB.   2. Community-acquired pneumonia: Currently on IV antibiotic Rocephin  3. Heart murmur: Appears to be aortic in origin. Pending echocardiogram  4. Interstitial lung disease: she has bilateral crackle, but does not appears to be fluid overload, crackle consistent with interstitial lung disease. She says she has always been SOB. She did quit smoking over 35 years ago.  5. HTN: On 240 mg diltiazem CD at home.  6. HLD: Although she carries a diagnosis of hyperlipidemia, I did not see any recent lipid panel. She is not taking any statin medication at home.    SignedAzalee Course, PA-C 09/12/2016, 3:06 PM  Personally seen and examined. Agree with above.  AFIB  - Now HR 80's  -  Cardizem at home, 240, continue  - avoid Bb  - discussed Eliquis or Xarelto. Her daughter, nurse, in room. They want to think about this before possibly starting. Explained stroke risk.   CAP  - fine crackles at bases of lungs (interstial fibrosis like)  - appears frail  - Abx  No further changes  Will follow  Donato Schultz, MD

## 2016-09-12 NOTE — Progress Notes (Signed)
Echocardiogram 2D Echocardiogram has been performed.  Savannah Gutierrez 09/12/2016, 4:09 PM

## 2016-09-13 LAB — CBC
HEMATOCRIT: 32.9 % — AB (ref 36.0–46.0)
HEMOGLOBIN: 10.6 g/dL — AB (ref 12.0–15.0)
MCH: 28.4 pg (ref 26.0–34.0)
MCHC: 32.2 g/dL (ref 30.0–36.0)
MCV: 88.2 fL (ref 78.0–100.0)
Platelets: 237 10*3/uL (ref 150–400)
RBC: 3.73 MIL/uL — AB (ref 3.87–5.11)
RDW: 14.2 % (ref 11.5–15.5)
WBC: 8.1 10*3/uL (ref 4.0–10.5)

## 2016-09-13 LAB — BASIC METABOLIC PANEL
ANION GAP: 6 (ref 5–15)
BUN: <5 mg/dL — ABNORMAL LOW (ref 6–20)
CHLORIDE: 104 mmol/L (ref 101–111)
CO2: 24 mmol/L (ref 22–32)
CREATININE: 0.55 mg/dL (ref 0.44–1.00)
Calcium: 8.7 mg/dL — ABNORMAL LOW (ref 8.9–10.3)
GFR calc non Af Amer: >60 mL/min (ref >60)
Glucose, Bld: 102 mg/dL — ABNORMAL HIGH (ref 65–99)
POTASSIUM: 3.7 mmol/L (ref 3.5–5.1)
SODIUM: 134 mmol/L — AB (ref 135–145)

## 2016-09-13 LAB — URINE CULTURE: Culture: <10000 — AB

## 2016-09-13 MED ORDER — IPRATROPIUM-ALBUTEROL 0.5-2.5 (3) MG/3ML IN SOLN
3.0000 mL | Freq: Three times a day (TID) | RESPIRATORY_TRACT | Status: DC
  Administered 2016-09-14: 3 mL via RESPIRATORY_TRACT
  Filled 2016-09-13: qty 3

## 2016-09-13 MED ORDER — HYDROCOD POLST-CPM POLST ER 10-8 MG/5ML PO SUER
5.0000 mL | Freq: Every day | ORAL | Status: DC
  Administered 2016-09-13 – 2016-09-14 (×2): 5 mL via ORAL
  Filled 2016-09-13 (×2): qty 5

## 2016-09-13 MED ORDER — IPRATROPIUM-ALBUTEROL 0.5-2.5 (3) MG/3ML IN SOLN
3.0000 mL | Freq: Four times a day (QID) | RESPIRATORY_TRACT | Status: DC
  Administered 2016-09-13 (×2): 3 mL via RESPIRATORY_TRACT
  Filled 2016-09-13 (×2): qty 3

## 2016-09-13 MED ORDER — AZITHROMYCIN 250 MG PO TABS
500.0000 mg | ORAL_TABLET | Freq: Every day | ORAL | Status: DC
  Administered 2016-09-13 – 2016-09-17 (×5): 500 mg via ORAL
  Filled 2016-09-13 (×5): qty 2

## 2016-09-13 MED ORDER — ALBUTEROL SULFATE (2.5 MG/3ML) 0.083% IN NEBU: 2.5000 mg | INHALATION_SOLUTION | Freq: Four times a day (QID) | RESPIRATORY_TRACT | Status: DC | PRN

## 2016-09-13 MED ORDER — DILTIAZEM HCL ER COATED BEADS 240 MG PO CP24
240.0000 mg | ORAL_CAPSULE | Freq: Every morning | ORAL | Status: DC
  Administered 2016-09-13 – 2016-09-14 (×2): 240 mg via ORAL
  Filled 2016-09-13 (×2): qty 1

## 2016-09-13 MED ORDER — GUAIFENESIN-DM 100-10 MG/5ML PO SYRP: 5.0000 mL | ORAL_SOLUTION | Freq: Four times a day (QID) | ORAL | Status: DC | PRN

## 2016-09-13 MED ORDER — GUAIFENESIN 200 MG PO TABS
200.0000 mg | ORAL_TABLET | ORAL | Status: DC | PRN
  Filled 2016-09-13: qty 1

## 2016-09-13 NOTE — Clinical Social Work Note (Signed)
Clinical Social Work Assessment  Patient Details  Name: Savannah Gutierrez MRN: [id number] Date of Birth: 06-09-1925  Date of referral:  09/13/16               Reason for consult:  Facility Placement                Permission sought to share information with:  Family Supports Permission granted to share information::  Yes, Verbal Permission Granted  Name::     Sharilyn Geisinger  Relationship::  Son  Contact Information:  [phone]  Housing/Transportation Living arrangements for the past 2 months:  [place] of Information:  Patient, Adult Children Patient Interpreter Needed:  None Criminal Activity/Legal Involvement Pertinent to Current Situation/Hospitalization:  No - Comment as needed Significant Relationships:  Adult Children Lives with:  Self, Facility Resident Do you feel safe going back to the place where you live?  Yes Need for family participation in patient care:  Yes (Comment)  Care giving concerns:  Patient son and daughter at bedside and state that patient is not yet aware of rehab plan.  CSW assisted in providing information to patient and making arrangements.   Social Worker assessment / plan:  Holiday representative met with patient and patient family at bedside to offer support and discuss patient plans at discharge.  Patient states that she lives at [place] independently and would like to return if possible.  CSW relayed the recommendation for [facility]-SNF and patient is agreeable.  Patient family states that they have notified facility.  CSW also called and spoke to [facility] with admissions at [facility] and confirmed patient ability to transition to SNF at discharge.  CSW remains available for support and to facilitate patient discharge needs once medically stable.  Employment status:  Retired Forensic scientist:  Medicare PT Recommendations:  [place] / Referral to community resources:  [place]  Patient/Family's Response to care:  Patient and family verbalize understanding and appreciation for CSW support and involvement.  Patient and family agreeable with placement [facility].  Patient/Family's Understanding of and Emotional Response to Diagnosis, Current Treatment, and Prognosis:  Patient is realistic about current medical state and limitations surrounding direct discharge home independently.  Patient is appropriate with conversation and desire to return home as soon as possible.  Emotional Assessment Appearance:  Appears stated age Attitude/Demeanor/Rapport:   (Engaged and Appropriate) Affect (typically observed):  Accepting, Appropriate, Calm, Happy Orientation:  Oriented to Self, Oriented to Situation, Oriented to Place, Oriented to  Time Alcohol / Substance use:  Not Applicable Psych involvement (Current and /or in the community):  No (Comment)  Discharge Needs  Concerns to be addressed:  Discharge Planning Concerns Readmission within the last 30 days:  No Current discharge risk:  Physical Impairment Barriers to Discharge:  Continued Medical Work up   [employer], [place]

## 2016-09-13 NOTE — Progress Notes (Signed)
PHARMACIST - PHYSICIAN COMMUNICATION  CONCERNING: Antibiotic IV to Oral Route Change Policy  RECOMMENDATION: This patient is receiving Azithromycin by the intravenous route.  Based on criteria approved by the Pharmacy and Therapeutics Committee, the antibiotic(s) is/are being converted to the equivalent oral dose form(s).   DESCRIPTION: These criteria include:  Patient being treated for a respiratory tract infection, urinary tract infection, cellulitis or clostridium difficile associated diarrhea if on metronidazole  The patient is not neutropenic and does not exhibit a GI malabsorption state  The patient is eating (either orally or via tube) and/or has been taking other orally administered medications for a least 24 hours  The patient is improving clinically and has a Tmax < 100.5  If you have questions about this conversion, please contact the Pharmacy Department  []   [phone]( [phone] )  [facility] []   [phone]( [phone] )  [facility] [x]   [phone]( [phone] )  [facility] []   [phone]( [phone] )  [facility] []   [phone]( [phone] )  [facility]   Harland Germanndrew Devine Klingel, [place] 09/13/2016 8:47 AM

## 2016-09-13 NOTE — NC FL2 (Signed)
[place] MEDICAID FL2 LEVEL OF CARE SCREENING TOOL     IDENTIFICATION  Patient Name: Savannah Gutierrez Birthdate: Mar 20, 1925 Sex: female Admission Date (Current Location): 09/11/2016  [facility] and [place] Number:  Producer, television/film/video and Address:  [facility], [address] [place], [place] [address]      Provider Number: [id number]  Attending Physician Name and Address:  Cathren Harsh, MD  Relative Name and Phone Number:       Current Level of Care: Hospital Recommended Level of Care: Skilled Nursing Facility Prior Approval Number:    Date Approved/Denied:   PASRR Number: [id number]  Discharge Plan: SNF    Current Diagnoses: Patient Active Problem List   Diagnosis Date Noted  . Sprain of left wrist   . CAP (community acquired pneumonia) 09/11/2016  . Atrial fibrillation with RVR (HCC) 09/11/2016  . Hyponatremia 09/11/2016  . Hypokalemia 09/11/2016    Orientation RESPIRATION BLADDER Height & Weight     Self, Time, Situation, Place  O2 (2L) Incontinent Weight: 116 lb 8 oz (52.8 kg) Height:  5' (152.4 cm)  BEHAVIORAL SYMPTOMS/MOOD NEUROLOGICAL BOWEL NUTRITION STATUS      Continent Diet (Heart Healthy with Thin Liquids)  AMBULATORY STATUS COMMUNICATION OF NEEDS Skin   Limited Assist Verbally Normal                       Personal Care Assistance Level of Assistance  Bathing, Feeding, Dressing Bathing Assistance: Limited assistance Feeding assistance: Independent Dressing Assistance: Limited assistance     Functional Limitations Info  Sight, Hearing, Speech Sight Info: Adequate Hearing Info: Impaired Speech Info: Adequate    SPECIAL CARE FACTORS FREQUENCY  PT (By licensed PT), OT (By licensed OT)     PT Frequency: 3-5x week OT Frequency: 3-5x week            Contractures Contractures Info: Not present    Additional Factors Info  Code Status, Allergies Code Status Info: Full Code Allergies Info:  Tape          Current Medications (09/13/2016):  This is the current hospital active medication list Current Facility-Administered Medications  Medication Dose Route Frequency Provider Last Rate Last Dose  . acetaminophen (TYLENOL) tablet 650 mg  650 mg Oral Q6H PRN Briscoe Deutscher, MD      . aspirin EC tablet 81 mg  81 mg Oral Daily Briscoe Deutscher, MD   81 mg at 09/13/16 0902  . azithromycin (ZITHROMAX) tablet 500 mg  500 mg Oral Daily Silvana Newness, RPH   500 mg at 09/13/16 0901  . cefTRIAXone (ROCEPHIN) 1 g in dextrose 5 % 50 mL IVPB  1 g Intravenous Q24H Rachel L Rumbarger, RPH   1 g at 09/13/16 1140  . chlorpheniramine-HYDROcodone (TUSSIONEX) 10-8 MG/5ML suspension 5 mL  5 mL Oral QHS Ripudeep K Rai, MD      . cholecalciferol (VITAMIN D) tablet 1,000 Units  1,000 Units Oral Daily Briscoe Deutscher, MD   1,000 Units at 09/13/16 0902  . diltiazem (CARDIZEM CD) 24 hr capsule 240 mg  240 mg Oral q morning - 10a Jake Bathe, MD   240 mg at 09/13/16 1146  . enoxaparin (LOVENOX) injection 40 mg  40 mg Subcutaneous Q24H Briscoe Deutscher, MD   40 mg at 09/12/16 2247  . guaiFENesin-dextromethorphan (ROBITUSSIN DM) 100-10 MG/5ML syrup 5 mL  5 mL Oral Q6H PRN Ripudeep Jenna Luo, MD      .  ipratropium-albuterol (DUONEB) 0.5-2.5 (3) MG/3ML nebulizer solution 3 mL  3 mL Nebulization Q6H Ripudeep K Rai, MD   3 mL at 09/13/16 1542  . venlafaxine (EFFEXOR) tablet 75 mg  75 mg Oral BID Briscoe Deutscherimothy S Opyd, MD   75 mg at 09/13/16 0901  . vitamin B-12 (CYANOCOBALAMIN) tablet 100 mcg  100 mcg Oral Daily Briscoe Deutscherimothy S Opyd, MD   100 mcg at 09/13/16 0902     Discharge Medications: Please see discharge summary for a list of discharge medications.  Relevant Imaging Results:  Relevant Lab Results:   Additional Information SSN [id number]   Macario GoldsJesse Aanyah Loa, [place] [phone]

## 2016-09-13 NOTE — Progress Notes (Signed)
Patient Name: Savannah Gutierrez Date of Encounter: 09/13/2016  Primary Cardiologist: [facility] Problem List     Principal Problem:   CAP (community acquired pneumonia) Active Problems:   Atrial fibrillation with RVR (HCC)   Hyponatremia   Hypokalemia   Sprain of left wrist     Subjective   Feels better. No cp no sob  Inpatient Medications    Scheduled Meds: . aspirin EC  81 mg Oral Daily  . azithromycin  500 mg Oral Daily  . cefTRIAXone (ROCEPHIN)  IV  1 g Intravenous Q24H  . chlorpheniramine-HYDROcodone  5 mL Oral QHS  . cholecalciferol  1,000 Units Oral Daily  . enoxaparin (LOVENOX) injection  40 mg Subcutaneous Q24H  . venlafaxine  75 mg Oral BID  . vitamin B-12  100 mcg Oral Daily   Continuous Infusions: . diltiazem (CARDIZEM) infusion 10 mg/hr (09/13/16 1100)   PRN Meds: acetaminophen, guaiFENesin-dextromethorphan   Vital Signs    Vitals:   09/12/16 1952 09/12/16 2342 09/13/16 0608 09/13/16 0739  BP: 114/66 118/79 107/63 113/66  Pulse: 88 83 (!) 101 82  Resp: (!) 25 (!) 29 (!) 32 (!) 25  Temp: 97.7 F (36.5 C) 97.7 F (36.5 C) 97.5 F (36.4 C) 97.5 F (36.4 C)  TempSrc: Oral Oral Oral Oral  SpO2: 94% 93% 90% 96%  Weight:   116 lb 8 oz (52.8 kg)   Height:        Intake/Output Summary (Last 24 hours) at 09/13/16 1126 Last data filed at 09/13/16 1016  Gross per 24 hour  Intake              520 ml  Output              300 ml  Net              220 ml   Filed Weights   09/11/16 1035 09/11/16 1734 09/13/16 0608  Weight: 116 lb (52.6 kg) 116 lb 12.8 oz (53 kg) 116 lb 8 oz (52.8 kg)    Physical Exam    GEN: Well nourished, well developed, in no acute distress.  HEENT: Grossly normal.  Neck: Supple, no JVD, carotid bruits, or masses. Cardiac: irreg irreg, no murmurs, rubs, or gallops. No clubbing, cyanosis, edema.  Radials/DP/PT 2+ and equal bilaterally.  Respiratory:  Respirations regular and unlabored, clear to auscultation  bilaterally. GI: Soft, nontender, nondistended, BS + x 4. MS: no deformity or atrophy. Skin: warm and dry, no rash. Neuro:  Strength and sensation are intact. Psych: AAOx3.  Normal affect.  Labs    CBC  Recent Labs  09/11/16 1120 09/13/16 0245  WBC 13.3* 8.1  NEUTROABS 12.2*  --   HGB 12.5 10.6*  HCT 38.6 32.9*  MCV 89.4 88.2  PLT 241 237   Basic Metabolic Panel  Recent Labs  09/12/16 0238 09/13/16 0245  NA 138 134*  K 3.6 3.7  CL 108 104  CO2 23 24  GLUCOSE 121* 102*  BUN 5* <5*  CREATININE 0.56 0.55  CALCIUM 8.4* 8.7*  MG 2.0  --    Liver Function Tests  Recent Labs  09/11/16 1120  AST 30  ALT 17  ALKPHOS 98  BILITOT 1.6*  PROT 7.0  ALBUMIN 3.3*   No results for input(s): LIPASE, AMYLASE in the last 72 hours. Cardiac Enzymes  Recent Labs  09/11/16 1120  TROPONINI <0.03     Recent Labs  09/12/16 0238  TSH 2.903  Telemetry    AFIB>>>>NSR with HR 80-100, occasional premature junctional beat, occasional PACs. No significant pauses. No further atrial fibrillation.- Personally Reviewed  ECG    AFIb - Personally Reviewed  Radiology    No results found.  Cardiac Studies   ECHO  - The patient was in atrial fibrillation. Normal LV size with mild   focal basal septal hypertrophy. EF 55-60%. Normal RV size with   mildly decreased systolic function. Mild mitral regurgitation.   Moderate tricuspid regurgitation. Mild pulmonary hypertension.   Patient Profile     83 year old female with episode of paroxysmal atrial fibrillation in the setting of upper respiratory illness.  Assessment & Plan    1. Atrial fibrillation with RVR of unknown duration. She has converted to sinus rhythm.             - Patient denies any chest pain or palpitation, she has no cardiac awareness. [facility], her heart rate at the time was 100.             - Heart rate has  significantly improved on IV diltiazem. Will change to oral diltiazem 240 as she was taking at home.              - This patients CHA2DS2-VASc Score and unadjusted Ischemic Stroke Rate (% per year) is equal to 7.2 % stroke rate/year from a score of 5  Above score calculated as 1 point each if present [CHF, HTN, DM, Vascular=MI/PAD/Aortic Plaque, Age if 65-74, or Female] Above score calculated as 2 points each if present [Age > 75, or Stroke/TIA/TE]             - This episode of atrial fibrillation with RVR may have occurred in the setting of pneumonia, however due to no cardiac awareness, exact timing of onset is unknown. TSH normal. Perhaps her respiratory illness can be labeled a reversible cause.             - Will continue rate control, I have discussed with the patient potential systemic anticoagulation. Her daughter was present for discussion as well. She is a Engineer, civil (consulting)nurse. They do not wish to start anticoagulation at this time. She did denies any prior history of stroke and also this is the only time she fell in the last 2 years. She did claim she has some degree of imbalance issue.             - her HR is controlled at this time. Given significant pulmonary issue, will avoid BB.   2. Community-acquired pneumonia: Currently on IV antibiotic Rocephin  3. Heart murmur: Mitral regurgitation, mild  4. Interstitial lung disease: she has bilateral crackle, but does not appears to be fluid overload, crackle consistent with interstitial lung disease. She says she has always been SOB. She did quit smoking over 35 years ago.  5. HTN: On 240 mg diltiazem CD at home. Resuming.  6. HLD: Although she carries a diagnosis of hyperlipidemia, I did not see any recent lipid panel. She is not taking any statin medication at home.  We will sign off. Please call with any questions.   Signed, Donato SchultzMark Skains, MD  09/13/2016, 11:26 AM

## 2016-09-13 NOTE — Progress Notes (Signed)
[facility]                                                                              Patient Demographics  Savannah DakinRhoda Gutierrez, is a 83 y.o. female, DOB - [birth year], ZOX:[id number]  Admit date - 09/11/2016   Admitting Physician Briscoe Deutscherimothy S Opyd, MD  Outpatient Primary MD for the patient is No PCP Per Patient  Outpatient specialists:   LOS - 2  days    Chief Complaint  Patient presents with  . Cough       Brief summary   83 year old female with hypertension, recent diagnosis for tachycardia with a new prescription for Cardizem presented with generalized weakness cough, dyspnea. She also had a recent fall on 12/31 on her outstretched left hand with pain and swelling of the left wrist. X-ray of the left wrist negative for fracture. Chest x-ray showed emphysema with bibasilar airspace disease concerning for pneumonia. Patient was found to be tachypneic and 30s, and tachycardia with heart rate in 120s. EKG showed atrial fibrillation with RVR.   Assessment & Plan    Community-acquired pneumonia - Slowly improving, Pt presented with fever, leukocytosis, rhonchi, and bibasilar infiltrates on CXR  - Follow blood cultures, HIV negative, follow influenza panel, respiratory virus panel - Continue IV Rocephin, Zithromax  - Tussinex changed to bedtime only per family's request - Added duo nebs, flutter valve  Atrial fibrillation with RVR  - Pt denies hx of palpitations, but also denied palpitations now with rate 150s. She was started on diltiazem in October 2017 - Troponin negative, TSH 2.9  - Currently, on Cardizem drip, cardiology following, planning to transition to oral Cardizem today - Discussed with patient and son at the bedside, regarding stroke risk with atrial fibrillation. She lives in an independent facility however had recent fall on 12/31. No prior history of GI bleed. Per family's wishes, no anticoagulation.    Hyponatremia  - Serum sodium is 133 on  admission in setting of apparent dehydration  - improving    Hypokalemia  - Improving     Wrist sprain, left - Pt fell onto an outstretched left hand on 12/31 and experienced pain and swelling - Radiographs negative for fracture, but widened scapholunate space suggests ligamentous injury  - She had wrist splint applied in ED; continue prn analgesia  - PTOT evaluation recommended skilled nursing facility, discussed with daughter, [facility]  Code Status:Full code DVT Prophylaxis:  Lovenox  Family Communication: Discussed in detail with the patient, all imaging results, lab results explained to the patient and patient's daughter in detail   Disposition Plan:  DC in next 24- 48hrs  Time Spent in minutes   25 minutes  Procedures:    Consultants:   Cardiology   Antimicrobials:   IV Zithromax  IV Rocephin    Medications  Scheduled Meds: . aspirin EC  81 mg Oral Daily  . azithromycin  500 mg Oral Daily  . cefTRIAXone (ROCEPHIN)  IV  1 g Intravenous Q24H  . chlorpheniramine-HYDROcodone  5 mL Oral QHS  . cholecalciferol  1,000 Units Oral Daily  . diltiazem  240 mg Oral q morning - 10a  . enoxaparin (LOVENOX) injection  40 mg Subcutaneous Q24H  . venlafaxine  75 mg Oral BID  . vitamin B-12  100 mcg Oral Daily   Continuous Infusions:  PRN Meds:.acetaminophen, guaiFENesin-dextromethorphan   Antibiotics   Anti-infectives    Start     Dose/Rate Route Frequency Ordered Stop   09/13/16 1000  azithromycin (ZITHROMAX) tablet 500 mg     500 mg Oral Daily 09/13/16 0849     09/12/16 1200  azithromycin (ZITHROMAX) 500 mg in dextrose 5 % 250 mL IVPB  Status:  Discontinued     500 mg 250 mL/hr over 60 Minutes Intravenous Every 24 hours 09/11/16 1125 09/13/16 0849   09/12/16 1200  cefTRIAXone (ROCEPHIN) 1 g in dextrose 5 % 50 mL IVPB     1 g 100 mL/hr over 30 Minutes Intravenous Every 24 hours 09/11/16 1125     09/11/16 1229   azithromycin (ZITHROMAX) 500 MG injection    Comments:  Reich, Jerrye Beavers   : cabinet override      09/11/16 1229 09/12/16 0044   09/11/16 1115  cefTRIAXone (ROCEPHIN) 1 g in dextrose 5 % 50 mL IVPB     1 g 100 mL/hr over 30 Minutes Intravenous  Once 09/11/16 1101 09/11/16 1230   09/11/16 1115  azithromycin (ZITHROMAX) 500 mg in dextrose 5 % 250 mL IVPB     500 mg 250 mL/hr over 60 Minutes Intravenous  Once 09/11/16 1101 09/11/16 1336        Subjective:   Savannah Gutierrez was seen and examined today.Cough improving after tussionex. No fevers or chills. Heart rate still somewhat elevated. No chest pain  Patient denies dizziness, shortness of breath, abdominal pain, N/V/D/C, new weakness, numbess, tingling. No acute events overnight.    Objective:   Vitals:   09/12/16 2342 09/13/16 0608 09/13/16 0739 09/13/16 1130  BP: 118/79 107/63 113/66 123/68  Pulse: 83 (!) 101 82 93  Resp: (!) 29 (!) 32 (!) 25 (!) 23  Temp: 97.7 F (36.5 C) 97.5 F (36.4 C) 97.5 F (36.4 C) 97.5 F (36.4 C)  TempSrc: Oral Oral Oral Oral  SpO2: 93% 90% 96% 93%  Weight:  52.8 kg (116 lb 8 oz)    Height:        Intake/Output Summary (Last 24 hours) at 09/13/16 1141 Last data filed at 09/13/16 1016  Gross per 24 hour  Intake              520 ml  Output              300 ml  Net              220 ml     Wt Readings from Last 3 Encounters:  09/13/16 52.8 kg (116 lb 8 oz)  11/11/13 56.7 kg (125 lb)     Exam  General: Alert and oriented x 3, NAD  HEENT:    Neck:   Cardiovascular: S1 S2 auscultated, ireg, ireg  Respiratory: Bilateral rhonchi    Gastrointestinal: Soft, nontender, nondistended, + bowel sounds  Ext: no cyanosis clubbing or edema  Neuro: no new deficits  Skin: No rashes  Psych: Normal affect and demeanor, alert and oriented x3    Data Reviewed:  I have personally reviewed following labs and imaging studies  Micro Results Recent Results (from the past 240 hour(s))  Blood  Culture (routine x 2)     Status: None (Preliminary result)  Collection Time: 09/11/16 11:20 AM  Result Value Ref Range Status   Specimen Description BLOOD RIGHT AC  Final   Special Requests BOTTLES DRAWN AEROBIC AND ANAEROBIC 5CC EACH  Final   Culture   Final    NO GROWTH 1 DAY Performed at [facility]    Report Status PENDING  Incomplete  Respiratory Panel by PCR     Status: None   Collection Time: 09/11/16 11:40 AM  Result Value Ref Range Status   Adenovirus NOT DETECTED NOT DETECTED Final   Coronavirus 229E NOT DETECTED NOT DETECTED Final   Coronavirus HKU1 NOT DETECTED NOT DETECTED Final   Coronavirus NL63 NOT DETECTED NOT DETECTED Final   Coronavirus OC43 NOT DETECTED NOT DETECTED Final   Metapneumovirus NOT DETECTED NOT DETECTED Final   Rhinovirus / Enterovirus NOT DETECTED NOT DETECTED Final   Influenza A NOT DETECTED NOT DETECTED Final   Influenza B NOT DETECTED NOT DETECTED Final   Parainfluenza Virus 1 NOT DETECTED NOT DETECTED Final   Parainfluenza Virus 2 NOT DETECTED NOT DETECTED Final   Parainfluenza Virus 3 NOT DETECTED NOT DETECTED Final   Parainfluenza Virus 4 NOT DETECTED NOT DETECTED Final   Respiratory Syncytial Virus NOT DETECTED NOT DETECTED Final   Bordetella pertussis NOT DETECTED NOT DETECTED Final   Chlamydophila pneumoniae NOT DETECTED NOT DETECTED Final   Mycoplasma pneumoniae NOT DETECTED NOT DETECTED Final    Comment: Performed at [facility]  Blood Culture (routine x 2)     Status: None (Preliminary result)   Collection Time: 09/11/16 11:45 AM  Result Value Ref Range Status   Specimen Description BLOOD RIGHT WRIST  Final   Special Requests BOTTLES DRAWN AEROBIC AND ANAEROBIC 5CC EACH  Final   Culture   Final    NO GROWTH 1 DAY Performed at [facility]    Report Status PENDING  Incomplete  Urine culture     Status: Abnormal   Collection Time: 09/11/16  2:30 PM  Result Value Ref Range Status   Specimen Description  URINE, CLEAN CATCH  Final   Special Requests NONE  Final   Culture (A)  Final    <10,000 COLONIES/mL INSIGNIFICANT GROWTH Performed at [facility]    Report Status 09/13/2016 FINAL  Final  MRSA PCR Screening     Status: None   Collection Time: 09/11/16  6:00 PM  Result Value Ref Range Status   MRSA by PCR NEGATIVE NEGATIVE Final    Comment:        The GeneXpert MRSA Assay (FDA approved for NASAL specimens only), is one component of a comprehensive MRSA colonization surveillance program. It is not intended to diagnose MRSA infection nor to guide or monitor treatment for MRSA infections.     Radiology Reports Dg Chest 2 View  Result Date: 09/11/2016 CLINICAL DATA:  Cough and congestion for 3 days. Progressive weakness. EXAM: CHEST  2 VIEW COMPARISON:  Two-view chest x-ray 07/12/2016 FINDINGS: The heart size is exaggerated by low lung volumes. Atherosclerotic calcifications are again noted in the aorta. Bibasilar airspace disease is superimposed on chronic interstitial disease bilaterally. There is no significant edema. No definite effusions are present. IMPRESSION: 1. Bibasilar airspace disease concerning for pneumonia superimposed on chronic interstitial lung disease. 2. Emphysema. 3. Aortic atherosclerosis. Electronically Signed   By: Marin Roberts M.D.   On: 09/11/2016 11:14   Dg Wrist Complete Left  Result Date: 09/11/2016 CLINICAL DATA:  Fall 2 days ago, left wrist pain EXAM: LEFT  WRIST - COMPLETE 3+ VIEW COMPARISON:  None. FINDINGS: Four views of the left wrist submitted. No acute fracture. Mild narrowing of radiocarpal joint space. Degenerative changes noted first carpometacarpal joint. There is widening of scapholunate joint space suspicious for ligamental injury. Mild chondrocalcinosis. Degenerative changes are noted anterior articular surface of scaphoid. IMPRESSION: No acute fracture. Mild narrowing of radiocarpal joint space. Degenerative changes noted first  carpometacarpal joint. There is widening of scapholunate joint space suspicious for ligamental injury. Electronically Signed   By: Natasha Mead M.D.   On: 09/11/2016 11:13    Lab Data:  CBC:  Recent Labs Lab 09/11/16 1120 09/13/16 0245  WBC 13.3* 8.1  NEUTROABS 12.2*  --   HGB 12.5 10.6*  HCT 38.6 32.9*  MCV 89.4 88.2  PLT 241 237   Basic Metabolic Panel:  Recent Labs Lab 09/11/16 1120 09/12/16 0238 09/13/16 0245  NA 133* 138 134*  K 3.2* 3.6 3.7  CL 100* 108 104  CO2 22 23 24   GLUCOSE 109* 121* 102*  BUN 11 5* <5*  CREATININE 0.62 0.56 0.55  CALCIUM 8.8* 8.4* 8.7*  MG  --  2.0  --    GFR: Estimated Creatinine Clearance: 34.2 mL/min (by C-G formula based on SCr of 0.55 mg/dL). Liver Function Tests:  Recent Labs Lab 09/11/16 1120  AST 30  ALT 17  ALKPHOS 98  BILITOT 1.6*  PROT 7.0  ALBUMIN 3.3*   No results for input(s): LIPASE, AMYLASE in the last 168 hours. No results for input(s): AMMONIA in the last 168 hours. Coagulation Profile: No results for input(s): INR, PROTIME in the last 168 hours. Cardiac Enzymes:  Recent Labs Lab 09/11/16 1120  TROPONINI <0.03   BNP (last 3 results) No results for input(s): PROBNP in the last 8760 hours. HbA1C: No results for input(s): HGBA1C in the last 72 hours. CBG: No results for input(s): GLUCAP in the last 168 hours. Lipid Profile: No results for input(s): CHOL, HDL, LDLCALC, TRIG, CHOLHDL, LDLDIRECT in the last 72 hours. Thyroid Function Tests:  Recent Labs  09/12/16 0238  TSH 2.903   Anemia Panel: No results for input(s): VITAMINB12, FOLATE, FERRITIN, TIBC, IRON, RETICCTPCT in the last 72 hours. Urine analysis:    Component Value Date/Time   COLORURINE YELLOW 09/11/2016 1430   APPEARANCEUR CLEAR 09/11/2016 1430   LABSPEC 1.007 09/11/2016 1430   PHURINE 6.0 09/11/2016 1430   GLUCOSEU NEGATIVE 09/11/2016 1430   HGBUR NEGATIVE 09/11/2016 1430   BILIRUBINUR NEGATIVE 09/11/2016 1430   KETONESUR 15  (A) 09/11/2016 1430   PROTEINUR NEGATIVE 09/11/2016 1430   NITRITE NEGATIVE 09/11/2016 1430   LEUKOCYTESUR NEGATIVE 09/11/2016 1430     Fynlee Rowlands M.D. [facility] 09/13/2016, 11:41 AM  Pager: [phone] Between 7am to 7pm - call Pager - [phone]  After 7pm go to [email or web address] - password [email or web address]  Call night coverage person covering after 7pm

## 2016-09-13 NOTE — Clinical Social Work Placement (Signed)
   CLINICAL SOCIAL WORK PLACEMENT  NOTE  Date:  09/13/2016  Patient Details  Name: Savannah Gutierrez MRN: [id number] Date of Birth: [birth year]  Clinical Social Work is seeking post-discharge placement for this patient at the Skilled  Nursing Facility level of care (*CSW will initial, date and re-position this form in  chart as items are completed):  Yes   Patient/family provided with [facility] Clinical Social Work Department's list of facilities offering this level of care within the geographic area requested by the patient (or if unable, by the patient's family).  Yes   Patient/family informed of their freedom to choose among providers that offer the needed level of care, that participate in Medicare, Medicaid or managed care program needed by the patient, have an available bed and are willing to accept the patient.  Yes   Patient/family informed of [facility]'s ownership interest in [facility] and [facility], as well as of the fact that they are under no obligation to receive care at these facilities.  PASRR submitted to EDS on       PASRR number received on       Existing PASRR number confirmed on 09/13/16     FL2 transmitted to all facilities in geographic area requested by pt/family on 09/13/16     FL2 transmitted to all facilities within larger geographic area on       Patient informed that his/her managed care company has contracts with or will negotiate with certain facilities, including the following:        Yes   Patient/family informed of bed offers received.  [name]     Physician recommends and patient chooses bed at      [name] on 09/14/16.  Patient to be transferred to facility by Ambulance     Patient family notified on 09/13/16 of transfer.  Name of family member notified:  Patient daughter at bedside     PHYSICIAN Please sign FL2, Please prepare priority discharge  summary, including medications     Additional Comment:   Macario GoldsJesse Phillipe Clemon, LCSW [phone]

## 2016-09-14 ENCOUNTER — Inpatient Hospital Stay (HOSPITAL_COMMUNITY): Payer: Medicare Other

## 2016-09-14 DIAGNOSIS — R0600 Dyspnea, unspecified: Secondary | ICD-10-CM

## 2016-09-14 DIAGNOSIS — I471 Supraventricular tachycardia: Secondary | ICD-10-CM

## 2016-09-14 LAB — CBC
HCT: 32.3 % — ABNORMAL LOW (ref 36.0–46.0)
Hemoglobin: 10.6 g/dL — ABNORMAL LOW (ref 12.0–15.0)
MCH: 28.7 pg (ref 26.0–34.0)
MCHC: 32.8 g/dL (ref 30.0–36.0)
MCV: 87.5 fL (ref 78.0–100.0)
PLATELETS: 267 10*3/uL (ref 150–400)
RBC: 3.69 MIL/uL — AB (ref 3.87–5.11)
RDW: 13.9 % (ref 11.5–15.5)
WBC: 9.2 10*3/uL (ref 4.0–10.5)

## 2016-09-14 LAB — BRAIN NATRIURETIC PEPTIDE: B Natriuretic Peptide: 236.7 pg/mL — ABNORMAL HIGH (ref 0.0–100.0)

## 2016-09-14 LAB — BASIC METABOLIC PANEL
ANION GAP: 7 (ref 5–15)
BUN: 5 mg/dL — AB (ref 6–20)
CHLORIDE: 102 mmol/L (ref 101–111)
CO2: 26 mmol/L (ref 22–32)
Calcium: 8.5 mg/dL — ABNORMAL LOW (ref 8.9–10.3)
Creatinine, Ser: 0.58 mg/dL (ref 0.44–1.00)
GFR calc Af Amer: >60 mL/min (ref >60)
GLUCOSE: 97 mg/dL (ref 65–99)
POTASSIUM: 3.3 mmol/L — AB (ref 3.5–5.1)
Sodium: 135 mmol/L (ref 135–145)

## 2016-09-14 MED ORDER — POTASSIUM CHLORIDE CRYS ER 20 MEQ PO TBCR
40.0000 meq | EXTENDED_RELEASE_TABLET | Freq: Once | ORAL | Status: AC
  Administered 2016-09-14: 40 meq via ORAL
  Filled 2016-09-14: qty 2

## 2016-09-14 MED ORDER — DILTIAZEM HCL 60 MG PO TABS
120.0000 mg | ORAL_TABLET | Freq: Once | ORAL | Status: AC
  Administered 2016-09-14: 120 mg via ORAL
  Filled 2016-09-14: qty 2

## 2016-09-14 MED ORDER — DILTIAZEM HCL ER COATED BEADS 180 MG PO CP24
360.0000 mg | ORAL_CAPSULE | Freq: Every morning | ORAL | Status: DC
  Administered 2016-09-15 – 2016-09-17 (×3): 360 mg via ORAL
  Filled 2016-09-14 (×4): qty 2

## 2016-09-14 MED ORDER — FUROSEMIDE 10 MG/ML IJ SOLN
40.0000 mg | Freq: Once | INTRAMUSCULAR | Status: DC
Start: 2016-09-14 — End: 2016-09-14

## 2016-09-14 MED ORDER — IPRATROPIUM-ALBUTEROL 0.5-2.5 (3) MG/3ML IN SOLN
3.0000 mL | Freq: Two times a day (BID) | RESPIRATORY_TRACT | Status: DC
Start: 2016-09-14 — End: 2016-09-15
  Administered 2016-09-14 – 2016-09-15 (×3): 3 mL via RESPIRATORY_TRACT
  Filled 2016-09-14 (×3): qty 3

## 2016-09-14 MED ORDER — FUROSEMIDE 10 MG/ML IJ SOLN
20.0000 mg | Freq: Once | INTRAMUSCULAR | Status: AC
  Administered 2016-09-14: 20 mg via INTRAVENOUS
  Filled 2016-09-14: qty 2

## 2016-09-14 NOTE — Progress Notes (Signed)
Physical Therapy Treatment Patient Details Name: Savannah Gutierrez MRN: [id number] DOB: 04-Jul-1925 Today's Date: 09/14/2016    History of Present Illness 83 y.o. female admitted with weakness, cough, dyspnea x 3 days, she also had a fall onto L wrist. Dx of PNA, L wrist sprain, a fib, hypokalemia, hyponatremia.     PT Comments    Pt ambulated 120' with RW and min A for loss of balance x 2. Hands on assist recommended for mobility as pt is unsteady. HR 90s with walking, SaO2 94% on RA walking.   Follow Up Recommendations  SNF;Other (comment) (assist for mobility)     Equipment Recommendations  None recommended by PT    Recommendations for Other Services OT consult     Precautions / Restrictions Precautions Precautions: Fall Required Braces or Orthoses: Other Brace/Splint Other Brace/Splint: L wrist splint Restrictions Weight Bearing Restrictions: No    Mobility  Bed Mobility               General bed mobility comments: up in recliner  Transfers Overall transfer level: Needs assistance Equipment used: Rolling walker (2 wheeled) Transfers: Sit to/from Stand Sit to Stand: Min assist Stand pivot transfers: Min assist       General transfer comment: VCs hand placement, min A to rise and steady  Ambulation/Gait Ambulation/Gait assistance: Min assist Ambulation Distance (Feet): 120 Feet Assistive device: Rolling walker (2 wheeled) Gait Pattern/deviations: Step-through pattern;Decreased stride length   Gait velocity interpretation: Below normal speed for age/gender General Gait Details: min A for mild LOB x 2, SaO2 94% , HR 90s with walking   Stairs            Wheelchair Mobility    Modified Rankin (Stroke Patients Only)       Balance Overall balance assessment: Needs assistance;History of Falls Sitting-balance support: No upper extremity supported Sitting balance-Leahy Scale: Good     Standing balance support: No upper extremity  supported Standing balance-Leahy Scale: Poor Standing balance comment: LOB posteriorly when standing to complete grooming tasks.                      Cognition Arousal/Alertness: Awake/alert Behavior During Therapy: WFL for tasks assessed/performed Overall Cognitive Status: Within Functional Limits for tasks assessed                      Exercises      General Comments        Pertinent Vitals/Pain Pain Assessment: No/denies pain    Home Living                      Prior Function            PT Goals (current goals can now be found in the care plan section) Acute Rehab PT Goals Patient Stated Goal: return to independence PT Goal Formulation: With patient/family Time For Goal Achievement: 09/26/16 Potential to Achieve Goals: Good Progress towards PT goals: Progressing toward goals    Frequency    Min 3X/week      PT Plan Current plan remains appropriate    Co-evaluation             End of Session Equipment Utilized During Treatment: Gait belt Activity Tolerance: Patient tolerated treatment well Patient left: in chair;with call bell/phone within reach;with family/visitor present     Time: 1417-1450 PT Time Calculation (min) (ACUTE ONLY): 33 min  Charges:  $Gait Training: 8-22 mins $Therapeutic Activity: 8-22  mins                    G Codes:      Tamala SerUhlenberg, Deeric Cruise Kistler 09/14/2016, 2:59 PM [phone]

## 2016-09-14 NOTE — Care Management Note (Addendum)
Case Management Note  Patient Details  Name: York RamRhoda M Meaney MRN: [id number] Date of Birth: 03-Sep-1925  Subjective/Objective:   Pt presented for CAP- Plan will be ffor d/c to SNF once stable @ [facility] once stable.                 Action/Plan: CSW is assisting with disposition needs. No further needs from CM at this time.   Expected Discharge Date:                  Expected Discharge Plan:  Skilled Nursing Facility  In-House Referral:  Clinical Social Work  Discharge planning Services  CM Consult  Post Acute Care Choice:  NA Choice offered to:  NA  DME Arranged:  N/A DME Agency:  NA  HH Arranged:  NA HH Agency:  NA  Status of Service:  Completed, signed off  If discussed at Long Length of Stay Meetings, dates discussed:    Additional Comments: 1038 09-17-16 Tomi BambergerBrenda Graves-Bigelow, RN,BSN [phone] Plan for patient to [facility] 09-17-16. CSW assisting with disposition needs. No further needs from CM at this time.  Gala LewandowskyGraves-Bigelow, Saki Legore Kaye, RN 09/14/2016, 11:01 AM

## 2016-09-14 NOTE — Care Management Important Message (Signed)
Important Message  Patient Details  Name: Savannah Gutierrez MRN: [id number] Date of Birth: 1925/04/28   Medicare Important Message Given:  Yes    Kyla BalzarineShealy, Marcella Charlson Abena 09/14/2016, 1:43 PM

## 2016-09-14 NOTE — Progress Notes (Signed)
[facility]                                                                              Patient Demographics  Savannah Gutierrez, is a 83 y.o. female, DOB - [birth year], ZOX:[id number]  Admit date - 09/11/2016   Admitting Physician Briscoe Deutscher, MD  Outpatient Primary MD for the patient is No PCP Per Patient  Outpatient specialists:   LOS - 3  days    Chief Complaint  Patient presents with  . Cough       Brief summary   83 year old female with hypertension, recent diagnosis for tachycardia with a new prescription for Cardizem presented with generalized weakness cough, dyspnea. She also had a recent fall on 12/31 on her outstretched left hand with pain and swelling of the left wrist. X-ray of the left wrist negative for fracture. Chest x-ray showed emphysema with bibasilar airspace disease concerning for pneumonia. Patient was found to be tachypneic and 30s, and tachycardia with heart rate in 120s. EKG showed atrial fibrillation with RVR.   Assessment & Plan    Community-acquired pneumonia -  Pt presented with fever, leukocytosis, rhonchi, and bibasilar infiltrates on CXR  - Follow blood cultures, HIV negative, follow influenza panel, respiratory virus panel - Continue IV Rocephin, Zithromax  - Tussinex changed to bedtime only per family's request, cont duo nebs, flutter valve - Chest x-ray repeated today, showed interstitial lung disease with patchy bibasilar lung opacities increased on the left worrisome for bibasilar aspiration/pneumonia with slightly increased as small bilateral pleural effusions - Elevated BNP 236.7, will give 1 dose of Lasix 20 mg IV 1  Atrial fibrillation with RVR  - Pt denies hx of palpitations, but also denied palpitations now with rate 150s. She was started on diltiazem in October 2017 - Troponin negative, TSH 2.9  - Discussed with patient's family regarding stroke risk with atrial fibrillation. She lives in an independent facility  however had recent fall on 12/31. No prior history of GI bleed. Per family's wishes, no anticoagulation.  - Per cardiology, heart rate still not well controlled, had converted to NSR, now MAT, Cardizem increased to 360 mg daily avoid BB due to pulmonary issues   Hyponatremia  - Serum sodium is 133 on admission in setting of apparent dehydration  - improving    Hypokalemia  - replaced    Wrist sprain, left - Pt fell onto an outstretched left hand on 12/31 and experienced pain and swelling - Radiographs negative for fracture, but widened scapholunate space suggests ligamentous injury  - She had wrist splint applied in ED; continue prn analgesia  - PTOT evaluation recommended skilled nursing facility,   Code Status:Full code DVT Prophylaxis:  Lovenox  Family Communication: Discussed in detail with the patient, all imaging results, lab results explained to the patient. Called patient's room, daughter wasn't in room, will try later    Disposition Plan:  DC in next 24- 48hrs  Time Spent in minutes   25 minutes  Procedures:    Consultants:   Cardiology   Antimicrobials:   IV Zithromax  IV Rocephin    Medications  Scheduled Meds: .  aspirin EC  81 mg Oral Daily  . azithromycin  500 mg Oral Daily  . cefTRIAXone (ROCEPHIN)  IV  1 g Intravenous Q24H  . chlorpheniramine-HYDROcodone  5 mL Oral QHS  . cholecalciferol  1,000 Units Oral Daily  . diltiazem  360 mg Oral q morning - 10a  . diltiazem  120 mg Oral Once  . enoxaparin (LOVENOX) injection  40 mg Subcutaneous Q24H  . furosemide  40 mg Intravenous Once  . ipratropium-albuterol  3 mL Nebulization BID  . potassium chloride  40 mEq Oral Once  . venlafaxine  75 mg Oral BID  . vitamin B-12  100 mcg Oral Daily   Continuous Infusions:  PRN Meds:.acetaminophen, albuterol, guaiFENesin-dextromethorphan   Antibiotics   Anti-infectives    Start     Dose/Rate Route Frequency Ordered Stop   09/13/16 1000  azithromycin  (ZITHROMAX) tablet 500 mg     500 mg Oral Daily 09/13/16 0849     09/12/16 1200  azithromycin (ZITHROMAX) 500 mg in dextrose 5 % 250 mL IVPB  Status:  Discontinued     500 mg 250 mL/hr over 60 Minutes Intravenous Every 24 hours 09/11/16 1125 09/13/16 0849   09/12/16 1200  cefTRIAXone (ROCEPHIN) 1 g in dextrose 5 % 50 mL IVPB     1 g 100 mL/hr over 30 Minutes Intravenous Every 24 hours 09/11/16 1125     09/11/16 1229  azithromycin (ZITHROMAX) 500 MG injection    Comments:  Reich, Jerrye Beavers   : cabinet override      09/11/16 1229 09/12/16 0044   09/11/16 1115  cefTRIAXone (ROCEPHIN) 1 g in dextrose 5 % 50 mL IVPB     1 g 100 mL/hr over 30 Minutes Intravenous  Once 09/11/16 1101 09/11/16 1230   09/11/16 1115  azithromycin (ZITHROMAX) 500 mg in dextrose 5 % 250 mL IVPB     500 mg 250 mL/hr over 60 Minutes Intravenous  Once 09/11/16 1101 09/11/16 1336        Subjective:   Savannah Gutierrez was seen and examined today. Cough improving. No fevers or chills. Heart rate still Uncontrolled. No chest pain  Patient denies dizziness, shortness of breath, abdominal pain, N/V/D/C, new weakness, numbess, tingling. No acute events overnight.    Objective:   Vitals:   09/14/16 0500 09/14/16 0738 09/14/16 0812 09/14/16 1003  BP:   (!) 118/59   Pulse: (!) 117     Resp: (!) 25     Temp:      TempSrc:      SpO2: 95% 95%  93%  Weight:      Height:        Intake/Output Summary (Last 24 hours) at 09/14/16 1117 Last data filed at 09/14/16 1055  Gross per 24 hour  Intake              660 ml  Output              650 ml  Net               10 ml     Wt Readings from Last 3 Encounters:  09/13/16 52.8 kg (116 lb 8 oz)  11/11/13 56.7 kg (125 lb)     Exam  General: Alert and oriented x 3, NAD  HEENT:    Neck:   Cardiovascular: S1 S2 auscultated, ireg, ireg  Respiratory: Rhonchi improving, bibasilar crackles,  Gastrointestinal: Soft, nontender, nondistended, + bowel sounds  Ext: no cyanosis  clubbing or edema  Neuro: no new deficits  Skin: No rashes  Psych: Normal affect and demeanor, alert and oriented x3    Data Reviewed:  I have personally reviewed following labs and imaging studies  Micro Results Recent Results (from the past 240 hour(s))  Blood Culture (routine x 2)     Status: None (Preliminary result)   Collection Time: 09/11/16 11:20 AM  Result Value Ref Range Status   Specimen Description BLOOD RIGHT AC  Final   Special Requests BOTTLES DRAWN AEROBIC AND ANAEROBIC 5CC EACH  Final   Culture   Final    NO GROWTH 2 DAYS Performed at [facility]    Report Status PENDING  Incomplete  Respiratory Panel by PCR     Status: None   Collection Time: 09/11/16 11:40 AM  Result Value Ref Range Status   Adenovirus NOT DETECTED NOT DETECTED Final   Coronavirus 229E NOT DETECTED NOT DETECTED Final   Coronavirus HKU1 NOT DETECTED NOT DETECTED Final   Coronavirus NL63 NOT DETECTED NOT DETECTED Final   Coronavirus OC43 NOT DETECTED NOT DETECTED Final   Metapneumovirus NOT DETECTED NOT DETECTED Final   Rhinovirus / Enterovirus NOT DETECTED NOT DETECTED Final   Influenza A NOT DETECTED NOT DETECTED Final   Influenza B NOT DETECTED NOT DETECTED Final   Parainfluenza Virus 1 NOT DETECTED NOT DETECTED Final   Parainfluenza Virus 2 NOT DETECTED NOT DETECTED Final   Parainfluenza Virus 3 NOT DETECTED NOT DETECTED Final   Parainfluenza Virus 4 NOT DETECTED NOT DETECTED Final   Respiratory Syncytial Virus NOT DETECTED NOT DETECTED Final   Bordetella pertussis NOT DETECTED NOT DETECTED Final   Chlamydophila pneumoniae NOT DETECTED NOT DETECTED Final   Mycoplasma pneumoniae NOT DETECTED NOT DETECTED Final    Comment: Performed at [facility]  Blood Culture (routine x 2)     Status: None (Preliminary result)   Collection Time: 09/11/16 11:45 AM  Result Value Ref Range Status   Specimen Description BLOOD RIGHT WRIST  Final   Special Requests BOTTLES DRAWN AEROBIC  AND ANAEROBIC 5CC EACH  Final   Culture   Final    NO GROWTH 2 DAYS Performed at [facility]    Report Status PENDING  Incomplete  Urine culture     Status: Abnormal   Collection Time: 09/11/16  2:30 PM  Result Value Ref Range Status   Specimen Description URINE, CLEAN CATCH  Final   Special Requests NONE  Final   Culture (A)  Final    <10,000 COLONIES/mL INSIGNIFICANT GROWTH Performed at [facility]    Report Status 09/13/2016 FINAL  Final  MRSA PCR Screening     Status: None   Collection Time: 09/11/16  6:00 PM  Result Value Ref Range Status   MRSA by PCR NEGATIVE NEGATIVE Final    Comment:        The GeneXpert MRSA Assay (FDA approved for NASAL specimens only), is one component of a comprehensive MRSA colonization surveillance program. It is not intended to diagnose MRSA infection nor to guide or monitor treatment for MRSA infections.     Radiology Reports Dg Chest 2 View  Result Date: 09/14/2016 CLINICAL DATA:  Dyspnea. EXAM: CHEST  2 VIEW COMPARISON:  09/11/2016 chest radiograph. FINDINGS: Stable cardiomediastinal silhouette with top-normal heart size and aortic atherosclerosis. No pneumothorax. Small bilateral pleural effusions, slightly increased bilaterally. Extensive distorted reticular opacities throughout both lungs predominantly in the periphery of the lungs, not appreciably changed. No overt pulmonary edema. Patchy  bibasilar lung opacities appear increased on the left. Stable vertebroplasty material in an upper lumbar vertebral body. IMPRESSION: 1. Extensive distorted reticular opacities throughout both lungs predominantly in the periphery of the lungs, not appreciably changed, suggesting interstitial lung disease. 2. Patchy bibasilar lung opacities, increased on the left, worrisome for bibasilar aspiration and/or pneumonia. 3. Slightly increased small bilateral pleural effusions. 4. Aortic atherosclerosis. Electronically Signed   By: Delbert PhenixJason A Poff M.D.    On: 09/14/2016 11:03   Dg Chest 2 View  Result Date: 09/11/2016 CLINICAL DATA:  Cough and congestion for 3 days. Progressive weakness. EXAM: CHEST  2 VIEW COMPARISON:  Two-view chest x-ray 07/12/2016 FINDINGS: The heart size is exaggerated by low lung volumes. Atherosclerotic calcifications are again noted in the aorta. Bibasilar airspace disease is superimposed on chronic interstitial disease bilaterally. There is no significant edema. No definite effusions are present. IMPRESSION: 1. Bibasilar airspace disease concerning for pneumonia superimposed on chronic interstitial lung disease. 2. Emphysema. 3. Aortic atherosclerosis. Electronically Signed   By: Marin Robertshristopher  Mattern M.D.   On: 09/11/2016 11:14   Dg Wrist Complete Left  Result Date: 09/11/2016 CLINICAL DATA:  Fall 2 days ago, left wrist pain EXAM: LEFT WRIST - COMPLETE 3+ VIEW COMPARISON:  None. FINDINGS: Four views of the left wrist submitted. No acute fracture. Mild narrowing of radiocarpal joint space. Degenerative changes noted first carpometacarpal joint. There is widening of scapholunate joint space suspicious for ligamental injury. Mild chondrocalcinosis. Degenerative changes are noted anterior articular surface of scaphoid. IMPRESSION: No acute fracture. Mild narrowing of radiocarpal joint space. Degenerative changes noted first carpometacarpal joint. There is widening of scapholunate joint space suspicious for ligamental injury. Electronically Signed   By: Natasha MeadLiviu  Pop M.D.   On: 09/11/2016 11:13    Lab Data:  CBC:  Recent Labs Lab 09/11/16 1120 09/13/16 0245 09/14/16 0505  WBC 13.3* 8.1 9.2  NEUTROABS 12.2*  --   --   HGB 12.5 10.6* 10.6*  HCT 38.6 32.9* 32.3*  MCV 89.4 88.2 87.5  PLT 241 237 267   Basic Metabolic Panel:  Recent Labs Lab 09/11/16 1120 09/12/16 0238 09/13/16 0245 09/14/16 0505  NA 133* 138 134* 135  K 3.2* 3.6 3.7 3.3*  CL 100* 108 104 102  CO2 22 23 24 26   GLUCOSE 109* 121* 102* 97  BUN 11 5*  <5* 5*  CREATININE 0.62 0.56 0.55 0.58  CALCIUM 8.8* 8.4* 8.7* 8.5*  MG  --  2.0  --   --    GFR: Estimated Creatinine Clearance: 34.2 mL/min (by C-G formula based on SCr of 0.58 mg/dL). Liver Function Tests:  Recent Labs Lab 09/11/16 1120  AST 30  ALT 17  ALKPHOS 98  BILITOT 1.6*  PROT 7.0  ALBUMIN 3.3*   No results for input(s): LIPASE, AMYLASE in the last 168 hours. No results for input(s): AMMONIA in the last 168 hours. Coagulation Profile: No results for input(s): INR, PROTIME in the last 168 hours. Cardiac Enzymes:  Recent Labs Lab 09/11/16 1120  TROPONINI <0.03   BNP (last 3 results) No results for input(s): PROBNP in the last 8760 hours. HbA1C: No results for input(s): HGBA1C in the last 72 hours. CBG: No results for input(s): GLUCAP in the last 168 hours. Lipid Profile: No results for input(s): CHOL, HDL, LDLCALC, TRIG, CHOLHDL, LDLDIRECT in the last 72 hours. Thyroid Function Tests:  Recent Labs  09/12/16 0238  TSH 2.903   Anemia Panel: No results for input(s): VITAMINB12, FOLATE, FERRITIN, TIBC, IRON, RETICCTPCT  in the last 72 hours. Urine analysis:    Component Value Date/Time   COLORURINE YELLOW 09/11/2016 1430   APPEARANCEUR CLEAR 09/11/2016 1430   LABSPEC 1.007 09/11/2016 1430   PHURINE 6.0 09/11/2016 1430   GLUCOSEU NEGATIVE 09/11/2016 1430   HGBUR NEGATIVE 09/11/2016 1430   BILIRUBINUR NEGATIVE 09/11/2016 1430   KETONESUR 15 (A) 09/11/2016 1430   PROTEINUR NEGATIVE 09/11/2016 1430   NITRITE NEGATIVE 09/11/2016 1430   LEUKOCYTESUR NEGATIVE 09/11/2016 1430     Arnella Pralle M.D. [facility] 09/14/2016, 11:17 AM  Pager: [phone] Between 7am to 7pm - call Pager - [phone]  After 7pm go to [email or web address] - password [email or web address]  Call night coverage person covering after 7pm

## 2016-09-14 NOTE — Progress Notes (Signed)
Patient Name: Savannah Gutierrez  Date of Encounter: 09/14/2016  Primary Cardiologist: [facility] Problem List     Principal Problem:   CAP (community acquired pneumonia) Active Problems:   Atrial fibrillation with RVR (HCC)   Hyponatremia   Hypokalemia   Sprain of left wrist     Subjective   Feels betterToday. Converted to sinus rhythm yesterday but this morning is in multifocal atrial tachycardia with a heart rate of 120 bpm.  Inpatient Medications    Scheduled Meds: . aspirin EC  81 mg Oral Daily  . azithromycin  500 mg Oral Daily  . cefTRIAXone (ROCEPHIN)  IV  1 g Intravenous Q24H  . chlorpheniramine-HYDROcodone  5 mL Oral QHS  . cholecalciferol  1,000 Units Oral Daily  . diltiazem  240 mg Oral q morning - 10a  . enoxaparin (LOVENOX) injection  40 mg Subcutaneous Q24H  . ipratropium-albuterol  3 mL Nebulization BID  . venlafaxine  75 mg Oral BID  . vitamin B-12  100 mcg Oral Daily   Continuous Infusions:  PRN Meds: acetaminophen, albuterol, guaiFENesin-dextromethorphan   Vital Signs    Vitals:   09/14/16 0400 09/14/16 0500 09/14/16 0738 09/14/16 0812  BP:    (!) 118/59  Pulse: (!) 111 (!) 117    Resp: 19 (!) 25    Temp:      TempSrc:      SpO2: 100% 95% 95%   Weight:      Height:        Intake/Output Summary (Last 24 hours) at 09/14/16 0840 Last data filed at 09/14/16 0831  Gross per 24 hour  Intake              660 ml  Output              450 ml  Net              210 ml   Filed Weights   09/11/16 1035 09/11/16 1734 09/13/16 0608  Weight: 116 lb (52.6 kg) 116 lb 12.8 oz (53 kg) 116 lb 8 oz (52.8 kg)    Physical Exam    GEN: Well nourished, well developed, in no acute distress.  HEENT: Grossly normal.  Neck: Supple, no JVD, carotid bruits, or masses. Cardiac: irreg irreg, no murmurs, rubs, or gallops. No clubbing, cyanosis, edema.  Radials/DP/PT 2+ and equal bilaterally.  Respiratory:  Respirations regular and unlabored, clear to  auscultation bilaterally. GI: Soft, nontender, nondistended, BS + x 4. MS: no deformity or atrophy. Skin: warm and dry, no rash. Neuro:  Strength and sensation are intact. Psych: AAOx3.  Normal affect.  Labs    CBC  Recent Labs  09/11/16 1120 09/13/16 0245 09/14/16 0505  WBC 13.3* 8.1 9.2  NEUTROABS 12.2*  --   --   HGB 12.5 10.6* 10.6*  HCT 38.6 32.9* 32.3*  MCV 89.4 88.2 87.5  PLT 241 237 267   Basic Metabolic Panel  Recent Labs  09/12/16 0238 09/13/16 0245 09/14/16 0505  NA 138 134* 135  K 3.6 3.7 3.3*  CL 108 104 102  CO2 23 24 26   GLUCOSE 121* 102* 97  BUN 5* <5* 5*  CREATININE 0.56 0.55 0.58  CALCIUM 8.4* 8.7* 8.5*  MG 2.0  --   --    Liver Function Tests  Recent Labs  09/11/16 1120  AST 30  ALT 17  ALKPHOS 98  BILITOT 1.6*  PROT 7.0  ALBUMIN 3.3*   No results for  input(s): LIPASE, AMYLASE in the last 72 hours. Cardiac Enzymes  Recent Labs  09/11/16 1120  TROPONINI <0.03     Recent Labs  09/12/16 0238  TSH 2.903    Telemetry    AFIB>>>>NSR with HR 80-100, occasional premature junctional beat, occasional PACs. No significant pauses. No further atrial fibrillation.- Personally Reviewed  ECG    AFIb - Personally Reviewed  Radiology    No results found.  Cardiac Studies   ECHO  - The patient was in atrial fibrillation. Normal LV size with mild   focal basal septal hypertrophy. EF 55-60%. Normal RV size with   mildly decreased systolic function. Mild mitral regurgitation.   Moderate tricuspid regurgitation. Mild pulmonary hypertension.   Patient Profile     83 year old female with episode of paroxysmal atrial fibrillation in the setting of upper respiratory illness.  Assessment & Plan    1. Atrial fibrillation with RVR of unknown duration. She had converted to sinus rhythm and currently is demonstrating multifocal atrial tachycardia which goes along with her current lung condition with heart rate of 120 bpm. Clear P  waves are seen on telemetry..             - Patient denies any chest pain or palpitation, she has no cardiac awareness. [facility], [facility], her heart rate at the time was 100.             - Heart rate had significantly improved on IV diltiazem.      we will go ahead and give diltiazem but now at dose of 360 once a day.         - This patients CHA2DS2-VASc Score and unadjusted Ischemic Stroke Rate (% per year) is equal to 7.2 % stroke rate/year from a score of 5  Above score calculated as 1 point each if present [CHF, HTN, DM, Vascular=MI/PAD/Aortic Plaque, Age if 65-74, or Female] Above score calculated as 2 points each if present [Age > 75, or Stroke/TIA/TE]             - This episode of atrial fibrillation with RVR may have occurred in the setting of pneumonia, however due to no cardiac awareness, exact timing of onset is unknown. TSH normal. Perhaps her respiratory illness can be labeled a reversible cause.             - I have discussed with the patient potential systemic anticoagulation. Her daughter was present for discussion as well. She is a Engineer, civil (consulting)nurse. They do not wish to start anticoagulation at this time. Understand stroke risk. She did denies any prior history of stroke and also this is the only time she fell in the last 2 years. She did claim she has some degree of imbalance issue.             -  Given significant pulmonary issue, will avoid BB.   2. Community-acquired pneumonia: Currently on IV antibiotic Rocephin  3. Heart murmur: Mitral regurgitation, mild  4. Interstitial lung disease: she has bilateral crackle, but does not appears to be fluid overload, crackle consistent with interstitial lung disease. She says she has always been SOB. She did quit smoking over 35 years ago.  5. HTN: On 240 mg diltiazem CD at home. This dose has been increased to 360.  6. HLD: Although she carries a diagnosis of  hyperlipidemia, I did not see any recent lipid panel. She is not taking  any statin medication at home.     Signed, Donato Schultz, MD  09/14/2016, 8:40 AM

## 2016-09-14 NOTE — Progress Notes (Signed)
Occupational Therapy Treatment Patient Details Name: Savannah Gutierrez MRN: [id number] DOB: 1925/07/16 Today's Date: 09/14/2016    History of present illness 83 y.o. female admitted with weakness, cough, dyspnea x 3 days, she also had a fall onto L wrist. Dx of PNA, L wrist sprain, a fib, hypokalemia, hyponatremia.    OT comments  Pt still with decreased attention and variable HR with activity.  HR increasing to 127 BPM with standing at the sink and down to 88 at rest prior to activity.  Oxygen sats decreasing to 88% on room air with mobility to the sink as well but increased with standing back to 91-92%.  Noted oxygen sats maintained at 93% at rest.  Pt still with increased LOB posteriorly with transfers and standing.  Will continue to follow but she will need follow-up SNF level therapy.    Follow Up Recommendations  Supervision/Assistance - 24 hour;SNF    Equipment Recommendations    TBD next venue of care   Recommendations for Other Services      Precautions / Restrictions Precautions Precautions: Fall Required Braces or Orthoses: Other Brace/Splint Other Brace/Splint: L wrist splint Restrictions Weight Bearing Restrictions: No       Mobility Bed Mobility Overal bed mobility: Needs Assistance Bed Mobility: Supine to Sit     Supine to sit: Min assist        Transfers Overall transfer level: Needs assistance Equipment used: Rolling walker (2 wheeled) Transfers: Sit to/from [employer] Sit to Stand: Min assist Stand pivot transfers: Min assist       General transfer comment: Mod instructional cueing for hand placement as she wants to pull up on the walker and keep her hands on the walker when sitting down.    Balance Overall balance assessment: Needs assistance;History of Falls Sitting-balance support: No upper extremity supported Sitting balance-Leahy Scale: Good     Standing balance support: No upper extremity supported Standing balance-Leahy  Scale: Poor Standing balance comment: LOB posteriorly when standing to complete grooming tasks.                     ADL Overall ADL's : Needs assistance/impaired     Grooming: Wash/dry hands;Oral care;Standing;Minimal assistance Grooming Details (indicate cue type and reason): LOB posteriorly                 Toilet Transfer: Moderate assistance;BSC;Stand-pivot   Toileting- Clothing Manipulation and Hygiene: Minimal assistance;Sit to/from stand         General ADL Comments: Pt required min assis for mobility to and from the sink for grooming tasks.  Frequent LOB when standing to brush her teeth and wash her hands.  HR variable from 104-127 during standing with O2 sats decreasing to 88% during mobility on RA.  They increased back to 93% with placement in the bedside chair.  Educated pt and daughter on AROM exercises for the right hand and wrist.  Completed 1 set of 20 repetitions for gross digit flexion and extension with abduction.  Pt falling asleep when attempting them, needing max instructioanal cueing for technique.  Completed 5 reps of wrist flexion/extension as pt requested to stop secondary to fatigue.  Provided handout for visual reference.        Vision                     Perception     Praxis      Cognition   Behavior During Therapy: Flat affect Overall Cognitive  Status: Impaired/Different from baseline Area of Impairment: Attention;Following commands   Current Attention Level: Focused    Following Commands: Follows one step commands consistently                         Pertinent Vitals/ Pain       Pain Assessment: No/denies pain         Frequency  Min 2X/week        Progress Toward Goals  OT Goals(current goals can now be found in the care plan section)  Progress towards OT goals: Progressing toward goals     Plan Discharge plan remains appropriate    Co-evaluation                 End of Session Equipment  Utilized During Treatment: Rolling walker   Activity Tolerance Patient tolerated treatment well   Patient Left in bed;with family/visitor present   Nurse Communication Mobility status        Time: 0925-1005 OT Time Calculation (min): 40 min  Charges: OT General Charges $OT Visit: 1 Procedure OT Treatments $Self Care/Home Management : 38-52 mins  Karin Griffith OTR/L 09/14/2016, 10:11 AM

## 2016-09-14 NOTE — Clinical Social Work Note (Signed)
Clinical Social Worker continuing to follow patient and family for support and discharge planning needs.  [facility]  [facility] that patient will have a bed available if ready over the weekend.  MD updated.  CSW remains available for support and to facilitate patient discharge needs.  Macario GoldsJesse Jamese Trauger, [place] [phone]

## 2016-09-15 LAB — BASIC METABOLIC PANEL
Anion gap: 8 (ref 5–15)
BUN: 6 mg/dL (ref 6–20)
CHLORIDE: 102 mmol/L (ref 101–111)
CO2: 26 mmol/L (ref 22–32)
CREATININE: 0.56 mg/dL (ref 0.44–1.00)
Calcium: 8.9 mg/dL (ref 8.9–10.3)
GFR calc Af Amer: >60 mL/min (ref >60)
GFR calc non Af Amer: >60 mL/min (ref >60)
GLUCOSE: 96 mg/dL (ref 65–99)
POTASSIUM: 3.9 mmol/L (ref 3.5–5.1)
SODIUM: 136 mmol/L (ref 135–145)

## 2016-09-15 LAB — CBC
HCT: 33.6 % — ABNORMAL LOW (ref 36.0–46.0)
HEMOGLOBIN: 10.9 g/dL — AB (ref 12.0–15.0)
MCH: 28.7 pg (ref 26.0–34.0)
MCHC: 32.4 g/dL (ref 30.0–36.0)
MCV: 88.4 fL (ref 78.0–100.0)
Platelets: 292 10*3/uL (ref 150–400)
RBC: 3.8 MIL/uL — AB (ref 3.87–5.11)
RDW: 14.3 % (ref 11.5–15.5)
WBC: 9.4 10*3/uL (ref 4.0–10.5)

## 2016-09-15 MED ORDER — IPRATROPIUM-ALBUTEROL 0.5-2.5 (3) MG/3ML IN SOLN: 3.0000 mL | Freq: Four times a day (QID) | RESPIRATORY_TRACT | Status: DC | PRN

## 2016-09-15 NOTE — Progress Notes (Signed)
[facility]                                                                              Patient Demographics  Savannah Gutierrez, is a 83 y.o. female, DOB - 1924/12/09, RUE:[id number]  Admit date - 09/11/2016   Admitting Physician Briscoe Deutscher, MD  Outpatient Primary MD for the patient is No PCP Per Patient  Outpatient specialists:   LOS - 4  days    Chief Complaint  Patient presents with  . Cough       Brief summary   83 year old female with hypertension, recent diagnosis for tachycardia with a new prescription for Cardizem presented with generalized weakness cough, dyspnea. She also had a recent fall on 12/31 on her outstretched left hand with pain and swelling of the left wrist. X-ray of the left wrist negative for fracture. Chest x-ray showed emphysema with bibasilar airspace disease concerning for pneumonia. Patient was found to be tachypneic in 30s, and tachycardia with heart rate in 120s. EKG showed atrial fibrillation with RVR.   Assessment & Plan    Community-acquired pneumonia - Slowly improving -  Pt presented with fever, leukocytosis, rhonchi, and bibasilar infiltrates on CXR  - Follow blood cultures, HIV negative, respiratory virus panel negative - Continue IV Rocephin, Zithromax , Robitussin as needed - Discontinued Tussionex, patient somewhat groggy in the mornings, cont duo nebs, flutter valve - Chest x-ray repeated 1/5 showed interstitial lung disease with patchy bibasilar lung opacities increased on the left worrisome for bibasilar aspiration/pneumonia with slightly increased as small bilateral pleural effusions - Elevated BNP 236.7, some improvement after Lasix 20 mg yesterday, will hold off on further Lasix as patient has Velcro rales due to institutional lung disease rather than fluid overload or CHF   Atrial fibrillation with RVR - now back in normal sinus rhythm - Troponin negative, TSH 2.9  - Discussed with patient's family regarding  stroke risk with atrial fibrillation. She lives in an independent facility however had recent fall on 12/31. No prior history of GI bleed. Per family's wishes, no anticoagulation.  - Per cardiology, Cardizem increased to 360 mg daily, avoid BB due to pulmonary issues   Hyponatremia  - Serum sodium is 133 on admission in setting of apparent dehydration  - improving    Hypokalemia  - replaced    Wrist sprain, left - Pt fell onto an outstretched left hand on 12/31 and experienced pain and swelling - Radiographs negative for fracture, but widened scapholunate space suggests ligamentous injury  - She had wrist splint applied in ED; continue prn analgesia  - PTOT evaluation recommended skilled nursing facility,   Code Status:Full code DVT Prophylaxis:  Lovenox  Family Communication: Discussed in detail with the patient, all imaging results, lab results explained to the patient. Discussed with patient's son at the bedside this morning in detail. I also called patient's daughter on her cell but unable to reach her, left voice mail.     Disposition Plan:  DC likely on Monday   Time Spent in minutes   25 minutes  Procedures:    Consultants:   Cardiology   Antimicrobials:  IV Zithromax  IV Rocephin    Medications  Scheduled Meds: . aspirin EC  81 mg Oral Daily  . azithromycin  500 mg Oral Daily  . cefTRIAXone (ROCEPHIN)  IV  1 g Intravenous Q24H  . cholecalciferol  1,000 Units Oral Daily  . diltiazem  360 mg Oral q morning - 10a  . enoxaparin (LOVENOX) injection  40 mg Subcutaneous Q24H  . ipratropium-albuterol  3 mL Nebulization BID  . venlafaxine  75 mg Oral BID  . vitamin B-12  100 mcg Oral Daily   Continuous Infusions:  PRN Meds:.acetaminophen, albuterol, guaiFENesin-dextromethorphan   Antibiotics   Anti-infectives    Start     Dose/Rate Route Frequency Ordered Stop   09/13/16 1000  azithromycin (ZITHROMAX) tablet 500 mg     500 mg Oral Daily 09/13/16 0849       09/12/16 1200  azithromycin (ZITHROMAX) 500 mg in dextrose 5 % 250 mL IVPB  Status:  Discontinued     500 mg 250 mL/hr over 60 Minutes Intravenous Every 24 hours 09/11/16 1125 09/13/16 0849   09/12/16 1200  cefTRIAXone (ROCEPHIN) 1 g in dextrose 5 % 50 mL IVPB     1 g 100 mL/hr over 30 Minutes Intravenous Every 24 hours 09/11/16 1125     09/11/16 1229  azithromycin (ZITHROMAX) 500 MG injection    Comments:  Savannah Gutierrez, Savannah Gutierrez   : cabinet override      09/11/16 1229 09/12/16 0044   09/11/16 1115  cefTRIAXone (ROCEPHIN) 1 g in dextrose 5 % 50 mL IVPB     1 g 100 mL/hr over 30 Minutes Intravenous  Once 09/11/16 1101 09/11/16 1230   09/11/16 1115  azithromycin (ZITHROMAX) 500 mg in dextrose 5 % 250 mL IVPB     500 mg 250 mL/hr over 60 Minutes Intravenous  Once 09/11/16 1101 09/11/16 1336        Subjective:   Savannah Gutierrez was seen and examined today. No fevers, somewhat groggy with Tussionex overnight. Overall improving heart rate better. Denies any chest pain or shortness of breath. Denies any abdominal pain, N/V/D/C, new weakness, numbess, tingling. No acute events overnight.  Son at the bedside  Objective:   Vitals:   09/14/16 2214 09/15/16 0300 09/15/16 0537 09/15/16 0821  BP:  111/63 122/73 122/73  Pulse: 90 (!) 101 (!) 107   Resp: 17 (!) 21    Temp:   97.6 F (36.4 C)   TempSrc:   Oral   SpO2: 95% 96% 96%   Weight:   51.7 kg (113 lb 14.4 oz)   Height:        Intake/Output Summary (Last 24 hours) at 09/15/16 1232 Last data filed at 09/15/16 0849  Gross per 24 hour  Intake              240 ml  Output              700 ml  Net             -460 ml     Wt Readings from Last 3 Encounters:  09/15/16 51.7 kg (113 lb 14.4 oz)  11/11/13 56.7 kg (125 lb)     Exam  General: Alert and oriented x 3, NAD, Weak   HEENT:    Neck:   Cardiovascular: S1 S2 auscultated, ireg, ireg  Respiratory: No wheezing or rhonchi, velcro rales  Gastrointestinal: Soft, nontender,  nondistended, + bowel sounds  Ext: no cyanosis clubbing or edema  Neuro: no new  deficits  Skin: No rashes  Psych: Normal affect and demeanor, alert and oriented x3    Data Reviewed:  I have personally reviewed following labs and imaging studies  Micro Results Recent Results (from the past 240 hour(s))  Blood Culture (routine x 2)     Status: None (Preliminary result)   Collection Time: 09/11/16 11:20 AM  Result Value Ref Range Status   Specimen Description BLOOD RIGHT AC  Final   Special Requests BOTTLES DRAWN AEROBIC AND ANAEROBIC 5CC EACH  Final   Culture   Final    NO GROWTH 4 DAYS Performed at [facility]    Report Status PENDING  Incomplete  Respiratory Panel by PCR     Status: None   Collection Time: 09/11/16 11:40 AM  Result Value Ref Range Status   Adenovirus NOT DETECTED NOT DETECTED Final   Coronavirus 229E NOT DETECTED NOT DETECTED Final   Coronavirus HKU1 NOT DETECTED NOT DETECTED Final   Coronavirus NL63 NOT DETECTED NOT DETECTED Final   Coronavirus OC43 NOT DETECTED NOT DETECTED Final   Metapneumovirus NOT DETECTED NOT DETECTED Final   Rhinovirus / Enterovirus NOT DETECTED NOT DETECTED Final   Influenza A NOT DETECTED NOT DETECTED Final   Influenza B NOT DETECTED NOT DETECTED Final   Parainfluenza Virus 1 NOT DETECTED NOT DETECTED Final   Parainfluenza Virus 2 NOT DETECTED NOT DETECTED Final   Parainfluenza Virus 3 NOT DETECTED NOT DETECTED Final   Parainfluenza Virus 4 NOT DETECTED NOT DETECTED Final   Respiratory Syncytial Virus NOT DETECTED NOT DETECTED Final   Bordetella pertussis NOT DETECTED NOT DETECTED Final   Chlamydophila pneumoniae NOT DETECTED NOT DETECTED Final   Mycoplasma pneumoniae NOT DETECTED NOT DETECTED Final    Comment: Performed at [facility]  Blood Culture (routine x 2)     Status: None (Preliminary result)   Collection Time: 09/11/16 11:45 AM  Result Value Ref Range Status   Specimen Description BLOOD RIGHT WRIST   Final   Special Requests BOTTLES DRAWN AEROBIC AND ANAEROBIC 5CC EACH  Final   Culture   Final    NO GROWTH 4 DAYS Performed at [facility]    Report Status PENDING  Incomplete  Urine culture     Status: Abnormal   Collection Time: 09/11/16  2:30 PM  Result Value Ref Range Status   Specimen Description URINE, CLEAN CATCH  Final   Special Requests NONE  Final   Culture (A)  Final    <10,000 COLONIES/mL INSIGNIFICANT GROWTH Performed at [facility]    Report Status 09/13/2016 FINAL  Final  MRSA PCR Screening     Status: None   Collection Time: 09/11/16  6:00 PM  Result Value Ref Range Status   MRSA by PCR NEGATIVE NEGATIVE Final    Comment:        The GeneXpert MRSA Assay (FDA approved for NASAL specimens only), is one component of a comprehensive MRSA colonization surveillance program. It is not intended to diagnose MRSA infection nor to guide or monitor treatment for MRSA infections.     Radiology Reports Dg Chest 2 View  Result Date: 09/14/2016 CLINICAL DATA:  Dyspnea. EXAM: CHEST  2 VIEW COMPARISON:  09/11/2016 chest radiograph. FINDINGS: Stable cardiomediastinal silhouette with top-normal heart size and aortic atherosclerosis. No pneumothorax. Small bilateral pleural effusions, slightly increased bilaterally. Extensive distorted reticular opacities throughout both lungs predominantly in the periphery of the lungs, not appreciably changed. No overt pulmonary edema. Patchy bibasilar lung opacities  appear increased on the left. Stable vertebroplasty material in an upper lumbar vertebral body. IMPRESSION: 1. Extensive distorted reticular opacities throughout both lungs predominantly in the periphery of the lungs, not appreciably changed, suggesting interstitial lung disease. 2. Patchy bibasilar lung opacities, increased on the left, worrisome for bibasilar aspiration and/or pneumonia. 3. Slightly increased small bilateral pleural effusions. 4. Aortic  atherosclerosis. Electronically Signed   By: Delbert Phenix M.D.   On: 09/14/2016 11:03   Dg Chest 2 View  Result Date: 09/11/2016 CLINICAL DATA:  Cough and congestion for 3 days. Progressive weakness. EXAM: CHEST  2 VIEW COMPARISON:  Two-view chest x-ray 07/12/2016 FINDINGS: The heart size is exaggerated by low lung volumes. Atherosclerotic calcifications are again noted in the aorta. Bibasilar airspace disease is superimposed on chronic interstitial disease bilaterally. There is no significant edema. No definite effusions are present. IMPRESSION: 1. Bibasilar airspace disease concerning for pneumonia superimposed on chronic interstitial lung disease. 2. Emphysema. 3. Aortic atherosclerosis. Electronically Signed   By: Marin Roberts M.D.   On: 09/11/2016 11:14   Dg Wrist Complete Left  Result Date: 09/11/2016 CLINICAL DATA:  Fall 2 days ago, left wrist pain EXAM: LEFT WRIST - COMPLETE 3+ VIEW COMPARISON:  None. FINDINGS: Four views of the left wrist submitted. No acute fracture. Mild narrowing of radiocarpal joint space. Degenerative changes noted first carpometacarpal joint. There is widening of scapholunate joint space suspicious for ligamental injury. Mild chondrocalcinosis. Degenerative changes are noted anterior articular surface of scaphoid. IMPRESSION: No acute fracture. Mild narrowing of radiocarpal joint space. Degenerative changes noted first carpometacarpal joint. There is widening of scapholunate joint space suspicious for ligamental injury. Electronically Signed   By: Natasha Mead M.D.   On: 09/11/2016 11:13    Lab Data:  CBC:  Recent Labs Lab 09/11/16 1120 09/13/16 0245 09/14/16 0505 09/15/16 0452  WBC 13.3* 8.1 9.2 9.4  NEUTROABS 12.2*  --   --   --   HGB 12.5 10.6* 10.6* 10.9*  HCT 38.6 32.9* 32.3* 33.6*  MCV 89.4 88.2 87.5 88.4  PLT 241 237 267 292   Basic Metabolic Panel:  Recent Labs Lab 09/11/16 1120 09/12/16 0238 09/13/16 0245 09/14/16 0505 09/15/16 0452    NA 133* 138 134* 135 136  K 3.2* 3.6 3.7 3.3* 3.9  CL 100* 108 104 102 102  CO2 22 23 24 26 26   GLUCOSE 109* 121* 102* 97 96  BUN 11 5* <5* 5* 6  CREATININE 0.62 0.56 0.55 0.58 0.56  CALCIUM 8.8* 8.4* 8.7* 8.5* 8.9  MG  --  2.0  --   --   --    GFR: Estimated Creatinine Clearance: 34.2 mL/min (by C-G formula based on SCr of 0.56 mg/dL). Liver Function Tests:  Recent Labs Lab 09/11/16 1120  AST 30  ALT 17  ALKPHOS 98  BILITOT 1.6*  PROT 7.0  ALBUMIN 3.3*   No results for input(s): LIPASE, AMYLASE in the last 168 hours. No results for input(s): AMMONIA in the last 168 hours. Coagulation Profile: No results for input(s): INR, PROTIME in the last 168 hours. Cardiac Enzymes:  Recent Labs Lab 09/11/16 1120  TROPONINI <0.03   BNP (last 3 results) No results for input(s): PROBNP in the last 8760 hours. HbA1C: No results for input(s): HGBA1C in the last 72 hours. CBG: No results for input(s): GLUCAP in the last 168 hours. Lipid Profile: No results for input(s): CHOL, HDL, LDLCALC, TRIG, CHOLHDL, LDLDIRECT in the last 72 hours. Thyroid Function Tests: No results  for input(s): TSH, T4TOTAL, FREET4, T3FREE, THYROIDAB in the last 72 hours. Anemia Panel: No results for input(s): VITAMINB12, FOLATE, FERRITIN, TIBC, IRON, RETICCTPCT in the last 72 hours. Urine analysis:    Component Value Date/Time   COLORURINE YELLOW 09/11/2016 1430   APPEARANCEUR CLEAR 09/11/2016 1430   LABSPEC 1.007 09/11/2016 1430   PHURINE 6.0 09/11/2016 1430   GLUCOSEU NEGATIVE 09/11/2016 1430   HGBUR NEGATIVE 09/11/2016 1430   BILIRUBINUR NEGATIVE 09/11/2016 1430   KETONESUR 15 (A) 09/11/2016 1430   PROTEINUR NEGATIVE 09/11/2016 1430   NITRITE NEGATIVE 09/11/2016 1430   LEUKOCYTESUR NEGATIVE 09/11/2016 1430     Pete Schnitzer M.D. [facility] 09/15/2016, 12:32 PM  Pager: [phone] Between 7am to 7pm - call Pager - [phone]  After 7pm go to [email or web address] - password [email or web address]  Call  night coverage person covering after 7pm

## 2016-09-15 NOTE — Plan of Care (Signed)
Problem: Skin Integrity: Goal: Risk for impaired skin integrity will decrease Outcome: Progressing No new signs of skin breakdown this shift. Pt turns herself in bed. Pillows used to support bony prominences. Will continue to monitor and encourage repositioning.

## 2016-09-15 NOTE — Progress Notes (Signed)
Patient Name: Savannah RamRhoda M Blansett Date of Encounter: 09/15/2016     Principal Problem:   CAP (community acquired pneumonia) Active Problems:   Atrial fibrillation with RVR (HCC)   Hyponatremia   Hypokalemia   Sprain of left wrist   Dyspnea    SUBJECTIVE  No chest pain or sob. She still feels frail and weak.  CURRENT MEDS . aspirin EC  81 mg Oral Daily  . azithromycin  500 mg Oral Daily  . cefTRIAXone (ROCEPHIN)  IV  1 g Intravenous Q24H  . cholecalciferol  1,000 Units Oral Daily  . diltiazem  360 mg Oral q morning - 10a  . enoxaparin (LOVENOX) injection  40 mg Subcutaneous Q24H  . ipratropium-albuterol  3 mL Nebulization BID  . venlafaxine  75 mg Oral BID  . vitamin B-12  100 mcg Oral Daily    OBJECTIVE  Vitals:   09/14/16 2214 09/15/16 0300 09/15/16 0537 09/15/16 0821  BP:  111/63 122/73 122/73  Pulse: 90 (!) 101 (!) 107   Resp: 17 (!) 21    Temp:   97.6 F (36.4 C)   TempSrc:   Oral   SpO2: 95% 96% 96%   Weight:   113 lb 14.4 oz (51.7 kg)   Height:        Intake/Output Summary (Last 24 hours) at 09/15/16 1128 Last data filed at 09/15/16 0849  Gross per 24 hour  Intake              240 ml  Output              700 ml  Net             -460 ml   Filed Weights   09/11/16 1734 09/13/16 0608 09/15/16 0537  Weight: 116 lb 12.8 oz (53 kg) 116 lb 8 oz (52.8 kg) 113 lb 14.4 oz (51.7 kg)    PHYSICAL EXAM  General: Pleasant, NAD. Neuro: Alert and oriented X 3. Moves all extremities spontaneously. Psych: Normal affect. HEENT:  Normal  Neck: Supple without bruits or JVD. Lungs:  Resp regular and unlabored, CTA. Heart: RRR no s3, s4, or murmurs. Abdomen: Soft, non-tender, non-distended, BS + x 4.  Extremities: No clubbing, cyanosis or edema. DP/PT/Radials 2+ and equal bilaterally.  Accessory Clinical Findings  CBC  Recent Labs  09/14/16 0505 09/15/16 0452  WBC 9.2 9.4  HGB 10.6* 10.9*  HCT 32.3* 33.6*  MCV 87.5 88.4  PLT 267 292   Basic Metabolic  Panel  Recent Labs  95/28/4100/01/25 0505 09/15/16 0452  NA 135 136  K 3.3* 3.9  CL 102 102  CO2 26 26  GLUCOSE 97 96  BUN 5* 6  CREATININE 0.58 0.56  CALCIUM 8.5* 8.9   Liver Function Tests No results for input(s): AST, ALT, ALKPHOS, BILITOT, PROT, ALBUMIN in the last 72 hours. No results for input(s): LIPASE, AMYLASE in the last 72 hours. Cardiac Enzymes No results for input(s): CKTOTAL, CKMB, CKMBINDEX, TROPONINI in the last 72 hours. BNP Invalid input(s): POCBNP D-Dimer No results for input(s): DDIMER in the last 72 hours. Hemoglobin A1C No results for input(s): HGBA1C in the last 72 hours. Fasting Lipid Panel No results for input(s): CHOL, HDL, LDLCALC, TRIG, CHOLHDL, LDLDIRECT in the last 72 hours. Thyroid Function Tests No results for input(s): TSH, T4TOTAL, T3FREE, THYROIDAB in the last 72 hours.  Invalid input(s): FREET3  TELE  Sinus tachycardia  Radiology/Studies  Dg Chest 2 View  Result Date: 09/14/2016 CLINICAL  DATA:  Dyspnea. EXAM: CHEST  2 VIEW COMPARISON:  09/11/2016 chest radiograph. FINDINGS: Stable cardiomediastinal silhouette with top-normal heart size and aortic atherosclerosis. No pneumothorax. Small bilateral pleural effusions, slightly increased bilaterally. Extensive distorted reticular opacities throughout both lungs predominantly in the periphery of the lungs, not appreciably changed. No overt pulmonary edema. Patchy bibasilar lung opacities appear increased on the left. Stable vertebroplasty material in an upper lumbar vertebral body. IMPRESSION: 1. Extensive distorted reticular opacities throughout both lungs predominantly in the periphery of the lungs, not appreciably changed, suggesting interstitial lung disease. 2. Patchy bibasilar lung opacities, increased on the left, worrisome for bibasilar aspiration and/or pneumonia. 3. Slightly increased small bilateral pleural effusions. 4. Aortic atherosclerosis. Electronically Signed   By: Delbert Phenix M.D.    On: 09/14/2016 11:03   Dg Chest 2 View  Result Date: 09/11/2016 CLINICAL DATA:  Cough and congestion for 3 days. Progressive weakness. EXAM: CHEST  2 VIEW COMPARISON:  Two-view chest x-ray 07/12/2016 FINDINGS: The heart size is exaggerated by low lung volumes. Atherosclerotic calcifications are again noted in the aorta. Bibasilar airspace disease is superimposed on chronic interstitial disease bilaterally. There is no significant edema. No definite effusions are present. IMPRESSION: 1. Bibasilar airspace disease concerning for pneumonia superimposed on chronic interstitial lung disease. 2. Emphysema. 3. Aortic atherosclerosis. Electronically Signed   By: Marin Roberts M.D.   On: 09/11/2016 11:14   Dg Wrist Complete Left  Result Date: 09/11/2016 CLINICAL DATA:  Fall 2 days ago, left wrist pain EXAM: LEFT WRIST - COMPLETE 3+ VIEW COMPARISON:  None. FINDINGS: Four views of the left wrist submitted. No acute fracture. Mild narrowing of radiocarpal joint space. Degenerative changes noted first carpometacarpal joint. There is widening of scapholunate joint space suspicious for ligamental injury. Mild chondrocalcinosis. Degenerative changes are noted anterior articular surface of scaphoid. IMPRESSION: No acute fracture. Mild narrowing of radiocarpal joint space. Degenerative changes noted first carpometacarpal joint. There is widening of scapholunate joint space suspicious for ligamental injury. Electronically Signed   By: Natasha Mead M.D.   On: 09/11/2016 11:13    ASSESSMENT AND PLAN  1. Atrial fib - she is maintaining NSR. Continue cardizem. 2. MAT - her symptoms are controlled and she has returned to NSR. Continue calcium channel blocker 3. Pneumonia - she is improved on IV anti-biotics.  Sharlot Gowda Ariyanna Oien,M.D.  09/15/2016 11:28 AMPatient ID: Savannah Gutierrez, female   DOB: Dec 21, 1924, 83 y.o.   MRN: [id number]

## 2016-09-16 LAB — CULTURE, BLOOD (ROUTINE X 2)
Culture: NO GROWTH
Culture: NO GROWTH

## 2016-09-16 MED ORDER — SENNOSIDES-DOCUSATE SODIUM 8.6-50 MG PO TABS
1.0000 | ORAL_TABLET | Freq: Two times a day (BID) | ORAL | Status: DC
  Administered 2016-09-16 – 2016-09-17 (×3): 1 via ORAL
  Filled 2016-09-16 (×3): qty 1

## 2016-09-16 MED ORDER — POLYETHYLENE GLYCOL 3350 17 G PO PACK: 17.0000 g | PACK | Freq: Every day | ORAL | Status: DC | PRN

## 2016-09-16 NOTE — Progress Notes (Signed)
Patient Name: Savannah RamRhoda M Gutierrez Date of Encounter: 09/16/2016     Principal Problem:   CAP (community acquired pneumonia) Active Problems:   Atrial fibrillation with RVR (HCC)   Hyponatremia   Hypokalemia   Sprain of left wrist   Dyspnea    SUBJECTIVE  Appetite better. Denies chest pain or sob. Still weak.  CURRENT MEDS . aspirin EC  81 mg Oral Daily  . azithromycin  500 mg Oral Daily  . cefTRIAXone (ROCEPHIN)  IV  1 g Intravenous Q24H  . cholecalciferol  1,000 Units Oral Daily  . diltiazem  360 mg Oral q morning - 10a  . enoxaparin (LOVENOX) injection  40 mg Subcutaneous Q24H  . venlafaxine  75 mg Oral BID  . vitamin B-12  100 mcg Oral Daily    OBJECTIVE  Vitals:   09/15/16 1300 09/15/16 2100 09/16/16 0628 09/16/16 0830  BP: 121/70 121/68 132/71 133/83  Pulse: 93 (!) 103 (!) 103   Resp: 16 (!) 32 (!) 28   Temp: 98.1 F (36.7 C) 98.5 F (36.9 C) 97.8 F (36.6 C)   TempSrc: Oral Oral Oral   SpO2: 95% 90% 95%   Weight:   116 lb 1.6 oz (52.7 kg)   Height:        Intake/Output Summary (Last 24 hours) at 09/16/16 1338 Last data filed at 09/16/16 1209  Gross per 24 hour  Intake              600 ml  Output             1151 ml  Net             -551 ml   Filed Weights   09/13/16 0608 09/15/16 0537 09/16/16 0628  Weight: 116 lb 8 oz (52.8 kg) 113 lb 14.4 oz (51.7 kg) 116 lb 1.6 oz (52.7 kg)    PHYSICAL EXAM  General: Pleasant, NAD. Neuro: Alert and oriented X 3. Moves all extremities spontaneously. Psych: Normal affect. HEENT:  Normal  Neck: Supple without bruits or JVD. Lungs:  Resp regular and unlabored, CTA. Heart: RRR no s3, s4, or murmurs. Abdomen: Soft, non-tender, non-distended, BS + x 4.  Extremities: No clubbing, cyanosis or edema. DP/PT/Radials 2+ and equal bilaterally.  Accessory Clinical Findings  CBC  Recent Labs  09/14/16 0505 09/15/16 0452  WBC 9.2 9.4  HGB 10.6* 10.9*  HCT 32.3* 33.6*  MCV 87.5 88.4  PLT 267 292   Basic  Metabolic Panel  Recent Labs  09/14/16 0505 09/15/16 0452  NA 135 136  K 3.3* 3.9  CL 102 102  CO2 26 26  GLUCOSE 97 96  BUN 5* 6  CREATININE 0.58 0.56  CALCIUM 8.5* 8.9   Liver Function Tests No results for input(s): AST, ALT, ALKPHOS, BILITOT, PROT, ALBUMIN in the last 72 hours. No results for input(s): LIPASE, AMYLASE in the last 72 hours. Cardiac Enzymes No results for input(s): CKTOTAL, CKMB, CKMBINDEX, TROPONINI in the last 72 hours. BNP Invalid input(s): POCBNP D-Dimer No results for input(s): DDIMER in the last 72 hours. Hemoglobin A1C No results for input(s): HGBA1C in the last 72 hours. Fasting Lipid Panel No results for input(s): CHOL, HDL, LDLCALC, TRIG, CHOLHDL, LDLDIRECT in the last 72 hours. Thyroid Function Tests No results for input(s): TSH, T4TOTAL, T3FREE, THYROIDAB in the last 72 hours.  Invalid input(s): FREET3  TELE  Atrial fib and MAT and sinus tachycardia  Radiology/Studies  Dg Chest 2 View  Result Date: 09/14/2016  CLINICAL DATA:  Dyspnea. EXAM: CHEST  2 VIEW COMPARISON:  09/11/2016 chest radiograph. FINDINGS: Stable cardiomediastinal silhouette with top-normal heart size and aortic atherosclerosis. No pneumothorax. Small bilateral pleural effusions, slightly increased bilaterally. Extensive distorted reticular opacities throughout both lungs predominantly in the periphery of the lungs, not appreciably changed. No overt pulmonary edema. Patchy bibasilar lung opacities appear increased on the left. Stable vertebroplasty material in an upper lumbar vertebral body. IMPRESSION: 1. Extensive distorted reticular opacities throughout both lungs predominantly in the periphery of the lungs, not appreciably changed, suggesting interstitial lung disease. 2. Patchy bibasilar lung opacities, increased on the left, worrisome for bibasilar aspiration and/or pneumonia. 3. Slightly increased small bilateral pleural effusions. 4. Aortic atherosclerosis. Electronically  Signed   By: Delbert Phenix M.D.   On: 09/14/2016 11:03   Dg Chest 2 View  Result Date: 09/11/2016 CLINICAL DATA:  Cough and congestion for 3 days. Progressive weakness. EXAM: CHEST  2 VIEW COMPARISON:  Two-view chest x-ray 07/12/2016 FINDINGS: The heart size is exaggerated by low lung volumes. Atherosclerotic calcifications are again noted in the aorta. Bibasilar airspace disease is superimposed on chronic interstitial disease bilaterally. There is no significant edema. No definite effusions are present. IMPRESSION: 1. Bibasilar airspace disease concerning for pneumonia superimposed on chronic interstitial lung disease. 2. Emphysema. 3. Aortic atherosclerosis. Electronically Signed   By: Marin Roberts M.D.   On: 09/11/2016 11:14   Dg Wrist Complete Left  Result Date: 09/11/2016 CLINICAL DATA:  Fall 2 days ago, left wrist pain EXAM: LEFT WRIST - COMPLETE 3+ VIEW COMPARISON:  None. FINDINGS: Four views of the left wrist submitted. No acute fracture. Mild narrowing of radiocarpal joint space. Degenerative changes noted first carpometacarpal joint. There is widening of scapholunate joint space suspicious for ligamental injury. Mild chondrocalcinosis. Degenerative changes are noted anterior articular surface of scaphoid. IMPRESSION: No acute fracture. Mild narrowing of radiocarpal joint space. Degenerative changes noted first carpometacarpal joint. There is widening of scapholunate joint space suspicious for ligamental injury. Electronically Signed   By: Natasha Mead M.D.   On: 09/11/2016 11:13    ASSESSMENT AND PLAN  1. MAT/Atrial fib - she is in MAT this afternoon. Continue the cardizem. I do not think her atrial arrhythmias would preclude her from going home if she is otherwise stable. 2. Pneumonia - she has had a slow and steady improvement.  Sharlot Gowda Vannah Nadal,M.D.  09/16/2016 1:38 PMPatient ID: Savannah Gutierrez, female   DOB: 1925/01/22, 83 y.o.   MRN: [id number]

## 2016-09-16 NOTE — Progress Notes (Signed)
[facility]                                                                              Patient Demographics  Savannah Gutierrez, is a 83 y.o. female, DOB - Mar 13, 1925, YNW:[id number]  Admit date - 09/11/2016   Admitting Physician Briscoe Deutscher, MD  Outpatient Primary MD for the patient is No PCP Per Patient  Outpatient specialists:   LOS - 5  days    Chief Complaint  Patient presents with  . Cough       Brief summary   83 year old female with hypertension, recent diagnosis for tachycardia with a new prescription for Cardizem presented with generalized weakness cough, dyspnea. She also had a recent fall on 12/31 on her outstretched left hand with pain and swelling of the left wrist. X-ray of the left wrist negative for fracture. Chest x-ray showed emphysema with bibasilar airspace disease concerning for pneumonia. Patient was found to be tachypneic in 30s, and tachycardia with heart rate in 120s. EKG showed atrial fibrillation with RVR.   Assessment & Plan    Community-acquired pneumonia - Slowly improving - Pt presented with fever, leukocytosis, rhonchi, and bibasilar infiltrates on CXR  - Follow blood cultures, HIV negative, respiratory virus panel negative - Continue IV Rocephin, Zithromax , Robitussin as needed -  cont duo nebs, flutter valve - Chest x-ray repeated 1/5 showed interstitial lung disease with patchy bibasilar lung opacities increased on the left worrisome for bibasilar aspiration/pneumonia with slightly increased as small bilateral pleural effusions - Elevated BNP 236.7,  improvement after Lasix, no further Lasix needed as patient has Velcro rales due to institutional lung disease rather than fluid overload or CHF   Atrial fibrillation with RVR - now back in normal sinus rhythm - Troponin negative, TSH 2.9  - Discussed with patient's family regarding stroke risk with atrial fibrillation. She lives in an independent facility however had recent  fall on 12/31. No prior history of GI bleed. Per family's wishes, no anticoagulation.  - Per cardiology, Cardizem increased to 360 mg daily, avoid BB due to pulmonary issues   Hyponatremia  - Serum sodium is 133 on admission in setting of apparent dehydration  - improving    Hypokalemia  - replaced    Wrist sprain, left - Pt fell onto an outstretched left hand on 12/31 and experienced pain and swelling - Radiographs negative for fracture, but widened scapholunate space suggests ligamentous injury  - She had wrist splint applied in ED; continue prn analgesia  - PTOT evaluation recommended skilled nursing facility,   Code Status:Full code DVT Prophylaxis:  Lovenox  Family Communication: Discussed in detail with the patient, all imaging results, lab results explained to the patient. Discussed with patient's daughter   Disposition Plan:  DC likely on Monday   Time Spent in minutes   15 minutes  Procedures:    Consultants:   Cardiology   Antimicrobials:   IV Zithromax  IV Rocephin    Medications  Scheduled Meds: . aspirin EC  81 mg Oral Daily  . azithromycin  500 mg Oral Daily  . cefTRIAXone (ROCEPHIN)  IV  1 g  Intravenous Q24H  . cholecalciferol  1,000 Units Oral Daily  . diltiazem  360 mg Oral q morning - 10a  . enoxaparin (LOVENOX) injection  40 mg Subcutaneous Q24H  . venlafaxine  75 mg Oral BID  . vitamin B-12  100 mcg Oral Daily   Continuous Infusions:  PRN Meds:.acetaminophen, albuterol, guaiFENesin-dextromethorphan, ipratropium-albuterol   Antibiotics   Anti-infectives    Start     Dose/Rate Route Frequency Ordered Stop   09/13/16 1000  azithromycin (ZITHROMAX) tablet 500 mg     500 mg Oral Daily 09/13/16 0849     09/12/16 1200  azithromycin (ZITHROMAX) 500 mg in dextrose 5 % 250 mL IVPB  Status:  Discontinued     500 mg 250 mL/hr over 60 Minutes Intravenous Every 24 hours 09/11/16 1125 09/13/16 0849   09/12/16 1200  cefTRIAXone (ROCEPHIN) 1 g in  dextrose 5 % 50 mL IVPB     1 g 100 mL/hr over 30 Minutes Intravenous Every 24 hours 09/11/16 1125     09/11/16 1229  azithromycin (ZITHROMAX) 500 MG injection    Comments:  Reich, Jerrye Beavers   : cabinet override      09/11/16 1229 09/12/16 0044   09/11/16 1115  cefTRIAXone (ROCEPHIN) 1 g in dextrose 5 % 50 mL IVPB     1 g 100 mL/hr over 30 Minutes Intravenous  Once 09/11/16 1101 09/11/16 1230   09/11/16 1115  azithromycin (ZITHROMAX) 500 mg in dextrose 5 % 250 mL IVPB     500 mg 250 mL/hr over 60 Minutes Intravenous  Once 09/11/16 1101 09/11/16 1336        Subjective:   Savannah Gutierrez was seen and examined today. Doing well, no issues overnight, feels much better although still weak.  heart rate better. No acute issues overnight. Denies any chest pain or shortness of breath. Denies any abdominal pain, N/V/D/C, new weakness, numbess, tingling.  Objective:   Vitals:   09/15/16 1300 09/15/16 2100 09/16/16 0628 09/16/16 0830  BP: 121/70 121/68 132/71 133/83  Pulse: 93 (!) 103 (!) 103   Resp: 16 (!) 32 (!) 28   Temp: 98.1 F (36.7 C) 98.5 F (36.9 C) 97.8 F (36.6 C)   TempSrc: Oral Oral Oral   SpO2: 95% 90% 95%   Weight:   52.7 kg (116 lb 1.6 oz)   Height:        Intake/Output Summary (Last 24 hours) at 09/16/16 1149 Last data filed at 09/16/16 0830  Gross per 24 hour  Intake              480 ml  Output              701 ml  Net             -221 ml     Wt Readings from Last 3 Encounters:  09/16/16 52.7 kg (116 lb 1.6 oz)  11/11/13 56.7 kg (125 lb)     Exam  General: Alert and oriented x 3, NAD, Weak   HEENT:    Neck:   Cardiovascular: S1 S2 auscultated, ireg, ireg  Respiratory: No wheezing or rhonchi, velcro rales  Gastrointestinal: Soft, NT, NB, nondistended, + bowel sounds  Ext: no cyanosis clubbing or edema  Neuro: no new deficits  Skin: No rashes  Psych: Normal affect and demeanor, alert and oriented x3    Data Reviewed:  I have personally  reviewed following labs and imaging studies  Micro Results Recent Results (from the past 240  hour(s))  Blood Culture (routine x 2)     Status: None (Preliminary result)   Collection Time: 09/11/16 11:20 AM  Result Value Ref Range Status   Specimen Description BLOOD RIGHT AC  Final   Special Requests BOTTLES DRAWN AEROBIC AND ANAEROBIC 5CC EACH  Final   Culture   Final    NO GROWTH 4 DAYS Performed at [facility]    Report Status PENDING  Incomplete  Respiratory Panel by PCR     Status: None   Collection Time: 09/11/16 11:40 AM  Result Value Ref Range Status   Adenovirus NOT DETECTED NOT DETECTED Final   Coronavirus 229E NOT DETECTED NOT DETECTED Final   Coronavirus HKU1 NOT DETECTED NOT DETECTED Final   Coronavirus NL63 NOT DETECTED NOT DETECTED Final   Coronavirus OC43 NOT DETECTED NOT DETECTED Final   Metapneumovirus NOT DETECTED NOT DETECTED Final   Rhinovirus / Enterovirus NOT DETECTED NOT DETECTED Final   Influenza A NOT DETECTED NOT DETECTED Final   Influenza B NOT DETECTED NOT DETECTED Final   Parainfluenza Virus 1 NOT DETECTED NOT DETECTED Final   Parainfluenza Virus 2 NOT DETECTED NOT DETECTED Final   Parainfluenza Virus 3 NOT DETECTED NOT DETECTED Final   Parainfluenza Virus 4 NOT DETECTED NOT DETECTED Final   Respiratory Syncytial Virus NOT DETECTED NOT DETECTED Final   Bordetella pertussis NOT DETECTED NOT DETECTED Final   Chlamydophila pneumoniae NOT DETECTED NOT DETECTED Final   Mycoplasma pneumoniae NOT DETECTED NOT DETECTED Final    Comment: Performed at [facility]  Blood Culture (routine x 2)     Status: None (Preliminary result)   Collection Time: 09/11/16 11:45 AM  Result Value Ref Range Status   Specimen Description BLOOD RIGHT WRIST  Final   Special Requests BOTTLES DRAWN AEROBIC AND ANAEROBIC 5CC EACH  Final   Culture   Final    NO GROWTH 4 DAYS Performed at [facility]    Report Status PENDING  Incomplete  Urine culture      Status: Abnormal   Collection Time: 09/11/16  2:30 PM  Result Value Ref Range Status   Specimen Description URINE, CLEAN CATCH  Final   Special Requests NONE  Final   Culture (A)  Final    <10,000 COLONIES/mL INSIGNIFICANT GROWTH Performed at [facility]    Report Status 09/13/2016 FINAL  Final  MRSA PCR Screening     Status: None   Collection Time: 09/11/16  6:00 PM  Result Value Ref Range Status   MRSA by PCR NEGATIVE NEGATIVE Final    Comment:        The GeneXpert MRSA Assay (FDA approved for NASAL specimens only), is one component of a comprehensive MRSA colonization surveillance program. It is not intended to diagnose MRSA infection nor to guide or monitor treatment for MRSA infections.     Radiology Reports Dg Chest 2 View  Result Date: 09/14/2016 CLINICAL DATA:  Dyspnea. EXAM: CHEST  2 VIEW COMPARISON:  09/11/2016 chest radiograph. FINDINGS: Stable cardiomediastinal silhouette with top-normal heart size and aortic atherosclerosis. No pneumothorax. Small bilateral pleural effusions, slightly increased bilaterally. Extensive distorted reticular opacities throughout both lungs predominantly in the periphery of the lungs, not appreciably changed. No overt pulmonary edema. Patchy bibasilar lung opacities appear increased on the left. Stable vertebroplasty material in an upper lumbar vertebral body. IMPRESSION: 1. Extensive distorted reticular opacities throughout both lungs predominantly in the periphery of the lungs, not appreciably changed, suggesting interstitial lung disease. 2. Patchy  bibasilar lung opacities, increased on the left, worrisome for bibasilar aspiration and/or pneumonia. 3. Slightly increased small bilateral pleural effusions. 4. Aortic atherosclerosis. Electronically Signed   By: Delbert Phenix M.D.   On: 09/14/2016 11:03   Dg Chest 2 View  Result Date: 09/11/2016 CLINICAL DATA:  Cough and congestion for 3 days. Progressive weakness. EXAM: CHEST  2 VIEW  COMPARISON:  Two-view chest x-ray 07/12/2016 FINDINGS: The heart size is exaggerated by low lung volumes. Atherosclerotic calcifications are again noted in the aorta. Bibasilar airspace disease is superimposed on chronic interstitial disease bilaterally. There is no significant edema. No definite effusions are present. IMPRESSION: 1. Bibasilar airspace disease concerning for pneumonia superimposed on chronic interstitial lung disease. 2. Emphysema. 3. Aortic atherosclerosis. Electronically Signed   By: Marin Roberts M.D.   On: 09/11/2016 11:14   Dg Wrist Complete Left  Result Date: 09/11/2016 CLINICAL DATA:  Fall 2 days ago, left wrist pain EXAM: LEFT WRIST - COMPLETE 3+ VIEW COMPARISON:  None. FINDINGS: Four views of the left wrist submitted. No acute fracture. Mild narrowing of radiocarpal joint space. Degenerative changes noted first carpometacarpal joint. There is widening of scapholunate joint space suspicious for ligamental injury. Mild chondrocalcinosis. Degenerative changes are noted anterior articular surface of scaphoid. IMPRESSION: No acute fracture. Mild narrowing of radiocarpal joint space. Degenerative changes noted first carpometacarpal joint. There is widening of scapholunate joint space suspicious for ligamental injury. Electronically Signed   By: Natasha Mead M.D.   On: 09/11/2016 11:13    Lab Data:  CBC:  Recent Labs Lab 09/11/16 1120 09/13/16 0245 09/14/16 0505 09/15/16 0452  WBC 13.3* 8.1 9.2 9.4  NEUTROABS 12.2*  --   --   --   HGB 12.5 10.6* 10.6* 10.9*  HCT 38.6 32.9* 32.3* 33.6*  MCV 89.4 88.2 87.5 88.4  PLT 241 237 267 292   Basic Metabolic Panel:  Recent Labs Lab 09/11/16 1120 09/12/16 0238 09/13/16 0245 09/14/16 0505 09/15/16 0452  NA 133* 138 134* 135 136  K 3.2* 3.6 3.7 3.3* 3.9  CL 100* 108 104 102 102  CO2 22 23 24 26 26   GLUCOSE 109* 121* 102* 97 96  BUN 11 5* <5* 5* 6  CREATININE 0.62 0.56 0.55 0.58 0.56  CALCIUM 8.8* 8.4* 8.7* 8.5* 8.9    MG  --  2.0  --   --   --    GFR: Estimated Creatinine Clearance: 34.2 mL/min (by C-G formula based on SCr of 0.56 mg/dL). Liver Function Tests:  Recent Labs Lab 09/11/16 1120  AST 30  ALT 17  ALKPHOS 98  BILITOT 1.6*  PROT 7.0  ALBUMIN 3.3*   No results for input(s): LIPASE, AMYLASE in the last 168 hours. No results for input(s): AMMONIA in the last 168 hours. Coagulation Profile: No results for input(s): INR, PROTIME in the last 168 hours. Cardiac Enzymes:  Recent Labs Lab 09/11/16 1120  TROPONINI <0.03   BNP (last 3 results) No results for input(s): PROBNP in the last 8760 hours. HbA1C: No results for input(s): HGBA1C in the last 72 hours. CBG: No results for input(s): GLUCAP in the last 168 hours. Lipid Profile: No results for input(s): CHOL, HDL, LDLCALC, TRIG, CHOLHDL, LDLDIRECT in the last 72 hours. Thyroid Function Tests: No results for input(s): TSH, T4TOTAL, FREET4, T3FREE, THYROIDAB in the last 72 hours. Anemia Panel: No results for input(s): VITAMINB12, FOLATE, FERRITIN, TIBC, IRON, RETICCTPCT in the last 72 hours. Urine analysis:    Component Value Date/Time  COLORURINE YELLOW 09/11/2016 1430   APPEARANCEUR CLEAR 09/11/2016 1430   LABSPEC 1.007 09/11/2016 1430   PHURINE 6.0 09/11/2016 1430   GLUCOSEU NEGATIVE 09/11/2016 1430   HGBUR NEGATIVE 09/11/2016 1430   BILIRUBINUR NEGATIVE 09/11/2016 1430   KETONESUR 15 (A) 09/11/2016 1430   PROTEINUR NEGATIVE 09/11/2016 1430   NITRITE NEGATIVE 09/11/2016 1430   LEUKOCYTESUR NEGATIVE 09/11/2016 1430     Arael Piccione M.D. [facility] 09/16/2016, 11:49 AM  Pager: [phone] Between 7am to 7pm - call Pager - [phone]  After 7pm go to [email or web address] - password [email or web address]  Call night coverage person covering after 7pm

## 2016-09-17 DIAGNOSIS — I471 Supraventricular tachycardia: Secondary | ICD-10-CM

## 2016-09-17 LAB — CBC
HCT: 34.6 % — ABNORMAL LOW (ref 36.0–46.0)
Hemoglobin: 11.6 g/dL — ABNORMAL LOW (ref 12.0–15.0)
MCH: 29.3 pg (ref 26.0–34.0)
MCHC: 33.5 g/dL (ref 30.0–36.0)
MCV: 87.4 fL (ref 78.0–100.0)
PLATELETS: 307 10*3/uL (ref 150–400)
RBC: 3.96 MIL/uL (ref 3.87–5.11)
RDW: 14.2 % (ref 11.5–15.5)
WBC: 9.2 10*3/uL (ref 4.0–10.5)

## 2016-09-17 LAB — BASIC METABOLIC PANEL
ANION GAP: 10 (ref 5–15)
BUN: 7 mg/dL (ref 6–20)
CALCIUM: 9 mg/dL (ref 8.9–10.3)
CO2: 23 mmol/L (ref 22–32)
Chloride: 102 mmol/L (ref 101–111)
Creatinine, Ser: 0.54 mg/dL (ref 0.44–1.00)
GFR calc non Af Amer: >60 mL/min (ref >60)
Glucose, Bld: 89 mg/dL (ref 65–99)
Potassium: 4.1 mmol/L (ref 3.5–5.1)
Sodium: 135 mmol/L (ref 135–145)

## 2016-09-17 MED ORDER — ACETAMINOPHEN 325 MG PO TABS: 650.0000 mg | ORAL_TABLET | Freq: Four times a day (QID) | ORAL | Status: DC | PRN

## 2016-09-17 MED ORDER — AZITHROMYCIN 500 MG PO TABS: 500.0000 mg | ORAL_TABLET | Freq: Every day | ORAL | Status: DC

## 2016-09-17 MED ORDER — GUAIFENESIN-DM 100-10 MG/5ML PO SYRP: 5.0000 mL | ORAL_SOLUTION | Freq: Four times a day (QID) | ORAL | 0 refills | Status: DC | PRN

## 2016-09-17 MED ORDER — CEFUROXIME AXETIL 500 MG PO TABS
500.0000 mg | ORAL_TABLET | Freq: Two times a day (BID) | ORAL | Status: DC
  Administered 2016-09-17: 500 mg via ORAL
  Filled 2016-09-17 (×2): qty 1

## 2016-09-17 MED ORDER — DILTIAZEM HCL ER COATED BEADS 360 MG PO CP24: 360.0000 mg | ORAL_CAPSULE | Freq: Every morning | ORAL | Status: AC

## 2016-09-17 MED ORDER — POLYETHYLENE GLYCOL 3350 17 G PO PACK: 17.0000 g | PACK | Freq: Every day | ORAL | 0 refills | Status: AC | PRN

## 2016-09-17 MED ORDER — SENNOSIDES-DOCUSATE SODIUM 8.6-50 MG PO TABS: 1.0000 | ORAL_TABLET | Freq: Two times a day (BID) | ORAL | Status: AC

## 2016-09-17 MED ORDER — CEFUROXIME AXETIL 500 MG PO TABS: 500.0000 mg | ORAL_TABLET | Freq: Two times a day (BID) | ORAL | Status: DC

## 2016-09-17 NOTE — Discharge Summary (Signed)
Physician Discharge Summary   Patient ID: Savannah Gutierrez MRN: [id number] DOB/AGE: 09/19/1924 83 y.o.  Admit date: 09/11/2016 Discharge date: 09/17/2016  Primary Care Physician:  Bill Salinasurbyfill, Holly I, NP  Discharge Diagnoses:    . CAP (community acquired pneumonia) . Atrial fibrillation with RVR (HCC)/MAT . Hyponatremia . Hypokalemia   Left wrist sprain   Consults:  Cardiology  Recommendations for Outpatient Follow-up:  1. Patient will need physical therapy 2. Please repeat CBC/BMET at next visit 3. Please follow blood/urine cultures till final   DIET: Heart healthy diet    Allergies:   Allergies  Allergen Reactions  . Tape Rash and Other (See Comments)    Skin is very thin!!     DISCHARGE MEDICATIONS: Current Discharge Medication List    START taking these medications   Details  acetaminophen (TYLENOL) 325 MG tablet Take 2 tablets (650 mg total) by mouth every 6 (six) hours as needed for mild pain, fever or headache.    azithromycin (ZITHROMAX) 500 MG tablet Take 1 tablet (500 mg total) by mouth daily. X 2 more days    cefUROXime (CEFTIN) 500 MG tablet Take 1 tablet (500 mg total) by mouth 2 (two) times daily with a meal. X 2 more days    guaiFENesin-dextromethorphan (ROBITUSSIN DM) 100-10 MG/5ML syrup Take 5 mLs by mouth every 6 (six) hours as needed for cough. Qty: 118 mL, Refills: 0    polyethylene glycol (MIRALAX / GLYCOLAX) packet Take 17 g by mouth daily as needed for moderate constipation. Qty: 14 each, Refills: 0    senna-docusate (SENOKOT-S) 8.6-50 MG tablet Take 1 tablet by mouth 2 (two) times daily.      CONTINUE these medications which have CHANGED   Details  diltiazem (CARDIZEM CD) 360 MG 24 hr capsule Take 1 capsule (360 mg total) by mouth every morning.      CONTINUE these medications which have NOT CHANGED   Details  albuterol (VENTOLIN HFA) 108 (90 Base) MCG/ACT inhaler Inhale 2 puffs into the lungs every 6 (six) hours as needed for  wheezing or shortness of breath.    Ascorbic Acid (VITAMIN C) 100 MG tablet Take 100 mg by mouth daily.    aspirin 81 MG tablet Take 81 mg by mouth daily.    cholecalciferol (VITAMIN D) 1000 UNITS tablet Take 1,000 Units by mouth daily.    Lactobacillus (ACIDOPHILUS PO) Take 1 capsule by mouth daily.    Multiple Vitamins-Minerals (OSTEO COMPLEX PO) Take 1 tablet by mouth daily.     venlafaxine XR (EFFEXOR XR) 75 MG 24 hr capsule Take 75 mg by mouth daily with breakfast.    vitamin B-12 (CYANOCOBALAMIN) 100 MCG tablet Take 100 mcg by mouth daily.      STOP taking these medications     Sennosides (SENOKOT PO)                Brief H and P: For complete details please refer to admission H and P, but in brief 83 year old female with hypertension, recent diagnosis for tachycardia with a new prescription for Cardizem presented with generalized weakness cough, dyspnea. She also had a recent fall on 12/31 on her outstretched left hand with pain and swelling of the left wrist. X-ray of the left wrist negative for fracture. Chest x-ray showed emphysema with bibasilar airspace disease concerning for pneumonia. Patient was found to be tachypneic in 30s, and tachycardia with heart rate in 120s. EKG showed atrial fibrillation with RVR.   Hospital Course:  Community-acquired  pneumonia - Slowly improving, patient presented with fever, leukocytosis, rhonchi, and bibasilar infiltrates on CXR  - Blood cultures negative to date, HIV negative, respiratory virus panel negative - Patient was placed on IV Rocephin and Zithromax , Robitussin as needed - Continue albuterol nebs as needed. Chest x-ray repeated 1/5 showed interstitial lung disease with patchy bibasilar lung opacities increased on the left worrisome for bibasilar aspiration/pneumonia with slightly increased as small bilateral pleural effusions. - Elevated BNP 236.7,  improvement after Lasix, no further Lasix needed as patient has Velcro  rales due to interstitial lung disease rather than fluid overload or CHF  - Continue oral Zithromax and Ceftin for 2 more days to complete full course  Atrial fibrillation with RVR - now back in normal sinus rhythm - Troponin negative, TSH 2.9  - Discussed with patient's family regarding stroke risk with atrial fibrillation. She lives in an independent facility however had recent fall on 12/31. No prior history of GI bleed. Per family's wishes, no anticoagulation.  - Per cardiology, Cardizem increased to 360 mg daily, avoid BB due to pulmonary issues   Hyponatremia  - Serum sodium is 133 on admission in setting of apparent dehydration  - improving, sodium 135 at the time of discharge    Hypokalemia  - replaced , potassium 4.1 at the time of discharge   Wrist sprain, left - Pt fell onto an outstretched left hand on 12/31 and experienced pain and swelling - Radiographs negative for fracture, but widened scapholunate space suggests ligamentous injury  - She had wrist splint applied in ED; continue prn analgesia  - PTOT evaluation recommended skilled nursing facility,   Code Status: Full code  Day of Discharge BP (!) 149/80 (BP Location: Left Arm) Comment: re checking  Pulse 99   Temp 98.2 F (36.8 C) (Axillary)   Resp (!) 23   Ht 5' (1.524 m)   Wt 50.8 kg (112 lb)   SpO2 93%   BMI 21.87 kg/m   Physical Exam: General: Alert and awake oriented x3 not in any acute distress, frail and weak. HEENT: anicteric sclera, pupils reactive to light and accommodation CVS: S1-S2 clear no murmur rubs or gallops Chest: velcro rales at the bases Abdomen: soft nontender, nondistended, normal bowel sounds Extremities: no cyanosis, clubbing or edema noted bilaterally Neuro: Cranial nerves II-XII intact, no focal neurological deficits   The results of significant diagnostics from this hospitalization (including imaging, microbiology, ancillary and laboratory) are listed below for reference.     LAB RESULTS: Basic Metabolic Panel:  Recent Labs Lab 09/12/16 0238  09/15/16 0452 09/17/16 0625  NA 138  < > 136 135  K 3.6  < > 3.9 4.1  CL 108  < > 102 102  CO2 23  < > 26 23  GLUCOSE 121*  < > 96 89  BUN 5*  < > 6 7  CREATININE 0.56  < > 0.56 0.54  CALCIUM 8.4*  < > 8.9 9.0  MG 2.0  --   --   --   < > = values in this interval not displayed. Liver Function Tests:  Recent Labs Lab 09/11/16 1120  AST 30  ALT 17  ALKPHOS 98  BILITOT 1.6*  PROT 7.0  ALBUMIN 3.3*   No results for input(s): LIPASE, AMYLASE in the last 168 hours. No results for input(s): AMMONIA in the last 168 hours. CBC:  Recent Labs Lab 09/11/16 1120  09/15/16 0452 09/17/16 0625  WBC 13.3*  < > 9.4  9.2  NEUTROABS 12.2*  --   --   --   HGB 12.5  < > 10.9* 11.6*  HCT 38.6  < > 33.6* 34.6*  MCV 89.4  < > 88.4 87.4  PLT 241  < > 292 307  < > = values in this interval not displayed. Cardiac Enzymes:  Recent Labs Lab 09/11/16 1120  TROPONINI <0.03   BNP: Invalid input(s): POCBNP CBG: No results for input(s): GLUCAP in the last 168 hours.  Significant Diagnostic Studies:  Dg Chest 2 View  Result Date: 09/11/2016 CLINICAL DATA:  Cough and congestion for 3 days. Progressive weakness. EXAM: CHEST  2 VIEW COMPARISON:  Two-view chest x-ray 07/12/2016 FINDINGS: The heart size is exaggerated by low lung volumes. Atherosclerotic calcifications are again noted in the aorta. Bibasilar airspace disease is superimposed on chronic interstitial disease bilaterally. There is no significant edema. No definite effusions are present. IMPRESSION: 1. Bibasilar airspace disease concerning for pneumonia superimposed on chronic interstitial lung disease. 2. Emphysema. 3. Aortic atherosclerosis. Electronically Signed   By: Marin Roberts M.D.   On: 09/11/2016 11:14   Dg Wrist Complete Left  Result Date: 09/11/2016 CLINICAL DATA:  Fall 2 days ago, left wrist pain EXAM: LEFT WRIST - COMPLETE 3+ VIEW COMPARISON:   None. FINDINGS: Four views of the left wrist submitted. No acute fracture. Mild narrowing of radiocarpal joint space. Degenerative changes noted first carpometacarpal joint. There is widening of scapholunate joint space suspicious for ligamental injury. Mild chondrocalcinosis. Degenerative changes are noted anterior articular surface of scaphoid. IMPRESSION: No acute fracture. Mild narrowing of radiocarpal joint space. Degenerative changes noted first carpometacarpal joint. There is widening of scapholunate joint space suspicious for ligamental injury. Electronically Signed   By: Natasha Mead M.D.   On: 09/11/2016 11:13    2D ECHO: Study Conclusions  - Left ventricle: The cavity size was normal. There was mild focal   basal hypertrophy of the septum. Indeterminant diastolic function   (atrial fibrillation). Systolic function was normal. The   estimated ejection fraction was in the range of 55% to 60%. Wall   motion was normal; there were no regional wall motion   abnormalities. - Aortic valve: There was no stenosis. - Mitral valve: Moderately calcified annulus. There was mild   regurgitation. - Left atrium: The atrium was mildly dilated. - Right ventricle: The cavity size was normal. Systolic function   was mildly reduced. - Tricuspid valve: There was moderate regurgitation. Peak RV-RA   gradient (S): 35 mm Hg. - Pulmonary arteries: PA peak pressure: 43 mm Hg (S). - Systemic veins: IVC measured 2.0 cm with < 50% respirophasic   variation, suggesting RA pressure 8 mmHg. - Pericardium, extracardiac: A trivial pericardial effusion was   identified.  Impressions:  - The patient was in atrial fibrillation. Normal LV size with mild   focal basal septal hypertrophy. EF 55-60%. Normal RV size with   mildly decreased systolic function. Mild mitral regurgitation.   Moderate tricuspid regurgitation. Mild pulmonary hypertension.   Disposition and Follow-up: Discharge Instructions    Diet -  low sodium heart healthy    Complete by:  As directed    Increase activity slowly    Complete by:  As directed        DISPOSITION: SNF   DISCHARGE FOLLOW-UP Follow-up Information    Bill Salinas I, NP. Schedule an appointment as soon as possible for a visit in 1 week(s).   Specialty:  Nurse Practitioner Contact information: [address] [facility] [place] [address] [phone]        Donato Schultz, MD. Schedule an appointment as soon as possible for a visit in 2 week(s).   Specialty:  Cardiology Contact information: [address] [place] [address] [phone]            Time spent on Discharge:   Signed:   Marqueta Pulley M.D. [facility] 09/17/2016, 10:12 AM Pager: [phone]

## 2016-09-17 NOTE — Clinical Social Work Placement (Signed)
   CLINICAL SOCIAL WORK PLACEMENT  NOTE  Date:  09/17/2016  Patient Details  Name: Savannah Gutierrez MRN: [id number] Date of Birth: Jun 11, 1925  Clinical Social Work is seeking post-discharge placement for this patient at the Skilled  Nursing Facility level of care (*CSW will initial, date and re-position this form in  chart as items are completed):  Yes   Patient/family provided with [facility] Clinical Social Work Department's list of facilities offering this level of care within the geographic area requested by the patient (or if unable, by the patient's family).  Yes   Patient/family informed of their freedom to choose among providers that offer the needed level of care, that participate in Medicare, Medicaid or managed care program needed by the patient, have an available bed and are willing to accept the patient.  Yes   Patient/family informed of [facility]'s ownership interest in [facility] and [facility], as well as of the fact that they are under no obligation to receive care at these facilities.  PASRR submitted to EDS on       PASRR number received on       Existing PASRR number confirmed on 09/13/16     FL2 transmitted to all facilities in geographic area requested by pt/family on 09/13/16     FL2 transmitted to all facilities within larger geographic area on       Patient informed that his/her managed care company has contracts with or will negotiate with certain facilities, including the following:        Yes   Patient/family informed of bed offers received.  [facility]     Physician recommends and patient chooses bed at      [facility] on 09/14/16.  Patient to be transferred to facility by Ambulance     Patient family notified on 09/13/16 of transfer.  Name of family member notified:  Patient daughter at bedside     PHYSICIAN Please sign FL2, Please prepare priority discharge  summary, including medications     Additional Comment:    _______________________________________________ Venita Lickampbell, Zahrah Sutherlin B, LCSW 09/17/2016, 3:07 PM

## 2016-09-17 NOTE — Care Management Important Message (Signed)
Important Message  Patient Details  Name: Savannah Gutierrez MRN: [id number] Date of Birth: March 18, 1925   Medicare Important Message Given:  Yes    Bain Whichard Abena 09/17/2016, 1:01 PM

## 2016-09-17 NOTE — Progress Notes (Signed)
Patient being [facility], Report called. No further questions at this time. [facility]

## 2016-09-25 NOTE — Progress Notes (Deleted)
Cardiology Office Note    Date:  09/25/2016   ID:  Savannah Gutierrez, DOB 1925-08-17, MRN [id number]  PCP:  Vernon Preyurbyfill, Holly I, NP  Cardiologist:  Dr. Anne FuSkains  CC: post hospital follow up  History of Present Illness:  Savannah Gutierrez is a 83 y.o. female with a history of HTN, HLD and PAF who presents to clinic for post hospital follow up.   Recently admitted 1/2-09/17/16 for CAP, wrist fracture (from fall) and Afib with RVR and MAT. She had some mild volume overload which responded to lasix. CXR showed interstitial lung disease and lung exam revealed velcro like crackles. Her home Cardizem was increased for rate control. Oral anticoagulation was discussed but patient and family preferred to avoid this despite risks. 2D ECHO showed normal EF, mild basal septal hypertrophy, mild RV dysfunction, mild MR, mod TR and mild pulm HTN.   Today she presents to clinic for follow up    Past Medical History:  Diagnosis Date  . Anxiety   . Bronchitis   . Bursitis of hip   . Complication of anesthesia    I  HAD DIFFICULTY WAKING ONE TIME "  . Depression   . GERD (gastroesophageal reflux disease)   . Hypercholesteremia   . Hypertension   . Memory loss   . Pneumonia 09/11/2016  . Shortness of breath     Past Surgical History:  Procedure Laterality Date  . CATARACT EXTRACTION    . CHOLECYSTECTOMY    . KNEE SURGERY      Current Medications: Outpatient Medications Prior to Visit  Medication Sig Dispense Refill  . acetaminophen (TYLENOL) 325 MG tablet Take 2 tablets (650 mg total) by mouth every 6 (six) hours as needed for mild pain, fever or headache.    . albuterol (VENTOLIN HFA) 108 (90 Base) MCG/ACT inhaler Inhale 2 puffs into the lungs every 6 (six) hours as needed for wheezing or shortness of breath.    . Ascorbic Acid (VITAMIN C) 100 MG tablet Take 100 mg by mouth daily.    Marland Kitchen. aspirin 81 MG tablet Take 81 mg by mouth daily.    Marland Kitchen. azithromycin (ZITHROMAX) 500 MG tablet Take 1 tablet  (500 mg total) by mouth daily. X 2 more days    . cefUROXime (CEFTIN) 500 MG tablet Take 1 tablet (500 mg total) by mouth 2 (two) times daily with a meal. X 2 more days    . cholecalciferol (VITAMIN D) 1000 UNITS tablet Take 1,000 Units by mouth daily.    Marland Kitchen. diltiazem (CARDIZEM CD) 360 MG 24 hr capsule Take 1 capsule (360 mg total) by mouth every morning.    Marland Kitchen. guaiFENesin-dextromethorphan (ROBITUSSIN DM) 100-10 MG/5ML syrup Take 5 mLs by mouth every 6 (six) hours as needed for cough. 118 mL 0  . Lactobacillus (ACIDOPHILUS PO) Take 1 capsule by mouth daily.    . Multiple Vitamins-Minerals (OSTEO COMPLEX PO) Take 1 tablet by mouth daily.     . polyethylene glycol (MIRALAX / GLYCOLAX) packet Take 17 g by mouth daily as needed for moderate constipation. 14 each 0  . senna-docusate (SENOKOT-S) 8.6-50 MG tablet Take 1 tablet by mouth 2 (two) times daily.    Marland Kitchen. venlafaxine XR (EFFEXOR XR) 75 MG 24 hr capsule Take 75 mg by mouth daily with breakfast.    . vitamin B-12 (CYANOCOBALAMIN) 100 MCG tablet Take 100 mcg by mouth daily.     No facility-administered medications prior to visit.      Allergies:  Tape   Social History   Social History  . Marital status: Widowed    Spouse name: N/A  . Number of children: N/A  . Years of education: N/A   Social History Main Topics  . Smoking status: Former Games developer  . Smokeless tobacco: Never Used  . Alcohol use No  . Drug use: No  . Sexual activity: Not on file   Other Topics Concern  . Not on file   Social History Narrative  . No narrative on file     Family History:  The patient's ***family history includes Heart disease in her mother.      *** ROS/PE    Wt Readings from Last 3 Encounters:  09/17/16 112 lb (50.8 kg)  11/11/13 125 lb (56.7 kg)      Studies/Labs Reviewed:   EKG:  EKG is*** ordered today.  The ekg ordered today demonstrates ***  Recent Labs: 09/11/2016: ALT 17 09/12/2016: Magnesium 2.0; TSH 2.903 09/14/2016: B Natriuretic  Peptide 236.7 09/17/2016: BUN 7; Creatinine, Ser 0.54; Hemoglobin 11.6; Platelets 307; Potassium 4.1; Sodium 135   Lipid Panel No results found for: CHOL, TRIG, HDL, CHOLHDL, VLDL, LDLCALC, LDLDIRECT  Additional studies/ records that were reviewed today include:  2D ECHO: 09/12/16 Study Conclusions - Left ventricle: The cavity size was normal. There was mild focal basal hypertrophy of the septum. Indeterminant diastolic function (atrial fibrillation). Systolic function was normal. The estimated ejection fraction was in the range of 55% to 60%. Wall motion was normal; there were no regional wall motion abnormalities. - Aortic valve: There was no stenosis. - Mitral valve: Moderately calcified annulus. There was mild regurgitation. - Left atrium: The atrium was mildly dilated. - Right ventricle: The cavity size was normal. Systolic function was mildly reduced. - Tricuspid valve: There was moderate regurgitation. Peak RV-RA gradient (S): 35 mm Hg. - Pulmonary arteries: PA peak pressure: 43 mm Hg (S). - Systemic veins: IVC measured 2.0 cm with < 50% respirophasic variation, suggesting RA pressure 8 mmHg. - Pericardium, extracardiac: A trivial pericardial effusion was identified. Impressions: - The patient was in atrial fibrillation. Normal LV size with mild focal basal septal hypertrophy. EF 55-60%. Normal RV size with mildly decreased systolic function. Mild mitral regurgitation. Moderate tricuspid regurgitation. Mild pulmonary hypertension.    ASSESSMENT & PLAN:   Persistent atrial fibrillation:  HTN:   ILD:   Medication Adjustments/Labs and Tests Ordered: Current medicines are reviewed at length with the patient today.  Concerns regarding medicines are outlined above.  Medication changes, Labs and Tests ordered today are listed in the Patient Instructions below. There are no Patient Instructions on file for this visit.   Signed, Cline Crock,  PA-C  09/25/2016 3:43 PM    [facility] [address] [place], [place], [place]  [address] Phone: [phone]; Fax: [phone]

## 2016-09-28 ENCOUNTER — Encounter: Payer: Medicare Other | Admitting: Physician Assistant

## 2016-09-28 NOTE — Progress Notes (Deleted)
Cardiology Office Note    Date:  09/28/2016   ID:  Savannah Gutierrez, DOB 09-21-1924, MRN [id number]  PCP:  Vernon Prey, NP  Cardiologist:  Dr. Anne Fu  CC: post hospital follow up  History of Present Illness:  Savannah Gutierrez is a 83 y.o. female with a history of HTN, HLD and PAF who presents to clinic for post hospital follow up.   Recently admitted 1/2-09/17/16 for CAP, wrist fracture (from fall) and Afib with RVR and MAT. She had some mild volume overload which responded to lasix. CXR showed interstitial lung disease and lung exam revealed velcro like crackles. Her home Cardizem was increased for rate control. Oral anticoagulation was discussed but patient and family preferred to avoid this despite risks. 2D ECHO showed normal EF, mild basal septal hypertrophy, mild RV dysfunction, mild MR, mod TR and mild pulm HTN.   Today she presents to clinic for follow up   Past Medical History:  Diagnosis Date  . Anxiety   . Bronchitis   . Bursitis of hip   . Complication of anesthesia    I  HAD DIFFICULTY WAKING ONE TIME "  . Depression   . GERD (gastroesophageal reflux disease)   . Hypercholesteremia   . Hypertension   . Memory loss   . Pneumonia 09/11/2016  . Shortness of breath     Past Surgical History:  Procedure Laterality Date  . CATARACT EXTRACTION    . CHOLECYSTECTOMY    . KNEE SURGERY      Current Medications: Outpatient Medications Prior to Visit  Medication Sig Dispense Refill  . acetaminophen (TYLENOL) 325 MG tablet Take 2 tablets (650 mg total) by mouth every 6 (six) hours as needed for mild pain, fever or headache.    . albuterol (VENTOLIN HFA) 108 (90 Base) MCG/ACT inhaler Inhale 2 puffs into the lungs every 6 (six) hours as needed for wheezing or shortness of breath.    . Ascorbic Acid (VITAMIN C) 100 MG tablet Take 100 mg by mouth daily.    Marland Kitchen aspirin 81 MG tablet Take 81 mg by mouth daily.    Marland Kitchen azithromycin (ZITHROMAX) 500 MG tablet Take 1 tablet  (500 mg total) by mouth daily. X 2 more days    . cefUROXime (CEFTIN) 500 MG tablet Take 1 tablet (500 mg total) by mouth 2 (two) times daily with a meal. X 2 more days    . cholecalciferol (VITAMIN D) 1000 UNITS tablet Take 1,000 Units by mouth daily.    Marland Kitchen diltiazem (CARDIZEM CD) 360 MG 24 hr capsule Take 1 capsule (360 mg total) by mouth every morning.    Marland Kitchen guaiFENesin-dextromethorphan (ROBITUSSIN DM) 100-10 MG/5ML syrup Take 5 mLs by mouth every 6 (six) hours as needed for cough. 118 mL 0  . Lactobacillus (ACIDOPHILUS PO) Take 1 capsule by mouth daily.    . Multiple Vitamins-Minerals (OSTEO COMPLEX PO) Take 1 tablet by mouth daily.     . polyethylene glycol (MIRALAX / GLYCOLAX) packet Take 17 g by mouth daily as needed for moderate constipation. 14 each 0  . senna-docusate (SENOKOT-S) 8.6-50 MG tablet Take 1 tablet by mouth 2 (two) times daily.    Marland Kitchen venlafaxine XR (EFFEXOR XR) 75 MG 24 hr capsule Take 75 mg by mouth daily with breakfast.    . vitamin B-12 (CYANOCOBALAMIN) 100 MCG tablet Take 100 mcg by mouth daily.     No facility-administered medications prior to visit.      Allergies:  Tape   Social History   Social History  . Marital status: Widowed    Spouse name: N/A  . Number of children: N/A  . Years of education: N/A   Social History Main Topics  . Smoking status: Former Games developermoker  . Smokeless tobacco: Never Used  . Alcohol use No  . Drug use: No  . Sexual activity: Not on file   Other Topics Concern  . Not on file   Social History Narrative  . No narrative on file     Family History:  The patient's ***family history includes Heart disease in her mother.      *** ROS/PE    Wt Readings from Last 3 Encounters:  09/17/16 112 lb (50.8 kg)  11/11/13 125 lb (56.7 kg)      Studies/Labs Reviewed:   EKG:  EKG is*** ordered today.  The ekg ordered today demonstrates ***  Recent Labs: 09/11/2016: ALT 17 09/12/2016: Magnesium 2.0; TSH 2.903 09/14/2016: B Natriuretic  Peptide 236.7 09/17/2016: BUN 7; Creatinine, Ser 0.54; Hemoglobin 11.6; Platelets 307; Potassium 4.1; Sodium 135   Lipid Panel No results found for: CHOL, TRIG, HDL, CHOLHDL, VLDL, LDLCALC, LDLDIRECT  Additional studies/ records that were reviewed today include:  2D ECHO: 09/12/16 Study Conclusions - Left ventricle: The cavity size was normal. There was mild focal basal hypertrophy of the septum. Indeterminant diastolic function (atrial fibrillation). Systolic function was normal. The estimated ejection fraction was in the range of 55% to 60%. Wall motion was normal; there were no regional wall motion abnormalities. - Aortic valve: There was no stenosis. - Mitral valve: Moderately calcified annulus. There was mild regurgitation. - Left atrium: The atrium was mildly dilated. - Right ventricle: The cavity size was normal. Systolic function was mildly reduced. - Tricuspid valve: There was moderate regurgitation. Peak RV-RA gradient (S): 35 mm Hg. - Pulmonary arteries: PA peak pressure: 43 mm Hg (S). - Systemic veins: IVC measured 2.0 cm with < 50% respirophasic variation, suggesting RA pressure 8 mmHg. - Pericardium, extracardiac: A trivial pericardial effusion was identified. Impressions: - The patient was in atrial fibrillation. Normal LV size with mild focal basal septal hypertrophy. EF 55-60%. Normal RV size with mildly decreased systolic function. Mild mitral regurgitation. Moderate tricuspid regurgitation. Mild pulmonary hypertension.    ASSESSMENT & PLAN:   Persistent atrial fibrillation:  HTN:   ILD:   Medication Adjustments/Labs and Tests Ordered: Current medicines are reviewed at length with the patient today.  Concerns regarding medicines are outlined above.  Medication changes, Labs and Tests ordered today are listed in the Patient Instructions below. There are no Patient Instructions on file for this visit.   Signed, Cline CrockKathryn  Toniqua Melamed, PA-C  09/28/2016 12:12 PM    [facility] [address], [place], [place]  [address] Phone: [phone]) [phone]; Fax: [phone]

## 2016-10-04 ENCOUNTER — Ambulatory Visit: Payer: Medicare Other | Admitting: Physician Assistant

## 2016-10-05 ENCOUNTER — Encounter: Payer: Self-pay | Admitting: *Deleted

## 2016-10-08 ENCOUNTER — Encounter: Payer: Self-pay | Admitting: Nurse Practitioner

## 2016-10-08 ENCOUNTER — Ambulatory Visit (INDEPENDENT_AMBULATORY_CARE_PROVIDER_SITE_OTHER): Payer: Medicare Other | Admitting: Nurse Practitioner

## 2016-10-08 ENCOUNTER — Telehealth: Payer: Self-pay | Admitting: *Deleted

## 2016-10-08 VITALS — BP 110/70 | HR 89 | Ht <= 58 in | Wt 112.0 lb

## 2016-10-08 DIAGNOSIS — I480 Paroxysmal atrial fibrillation: Secondary | ICD-10-CM | POA: Diagnosis not present

## 2016-10-08 NOTE — Patient Instructions (Addendum)
We will be checking the following labs today - NONE   Medication Instructions:    Continue with your current medicines.     Testing/Procedures To Be Arranged:  N/A  Follow-Up:   See us back as needed.     Other Special Instructions:   N/A    If you need a refill on your cardiac medications before your next appointment, please call your pharmacy.   Call the [facility] office at [phone] if you have any questions, problems or concerns.      

## 2016-10-08 NOTE — Progress Notes (Signed)
CARDIOLOGY OFFICE NOTE  Date:  10/08/2016    York Ram Date of Birth: 07-04-1925 Medical Record #[id number]  PCP:  Vernon Prey, NP  Cardiologist:  Tyrone Sage Skains/Taylor  Chief Complaint  Patient presents with  . Atrial Fibrillation    Post hospital visit - seen for Dr. Rosine Abe    History of Present Illness: Savannah Gutierrez is a 83 y.o. female who presents today for a post hospital visit. Seen for Dr. Anne Fu and Ladona Ridgel.   She has a history of HTN, tachycardia, HLD and advanced age.   She presented to the ER earlier this month with generalized weakness cough, dyspnea. She also had a recent fall on 12/31 on her outstretched left hand with pain and swelling of the left wrist. X-ray of the left wrist negative for fracture. Chest x-ray showed emphysema with bibasilar airspace disease concerning for pneumonia. Patient was found to be tachypneic in 30s, and tachycardia with heart rate in 120s. EKG showed atrial fibrillation with RVR. Cardiology was consulted. No beta blocker due to her pulmonary issues. Family did not wish for her to be placed on anticoagulation. Her CCB was increased. Her pneumonia was treated. SNF was recommended.   Comes in today. Here with her daughter. Has been out of the hospital for the past 2 weeks. No real issues so far. She is hard of hearing. Admits that her memory is failing. Planning on moving to Assisted Living. Daughter is here from [place] tidying up affairs. No real palpitations. No chest pain. She notes her balance is very poor. Still has her splint on the left arm. She will get short of breath if she overexerts.  Using her walker. She is using 2 of the 180 mg Diltiazem's to make 360 mg.   Past Medical History:  Diagnosis Date  . Anxiety   . Bronchitis   . Bursitis of hip   . Complication of anesthesia    I  HAD DIFFICULTY WAKING ONE TIME "  . Depression   . GERD (gastroesophageal reflux disease)   . Hypercholesteremia   .  Hypertension   . Memory loss   . Pneumonia 09/11/2016  . Shortness of breath     Past Surgical History:  Procedure Laterality Date  . CATARACT EXTRACTION    . CHOLECYSTECTOMY    . KNEE SURGERY       Medications: Current Outpatient Prescriptions  Medication Sig Dispense Refill  . acetaminophen (TYLENOL) 325 MG tablet Take 2 tablets (650 mg total) by mouth every 6 (six) hours as needed for mild pain, fever or headache.    . albuterol (VENTOLIN HFA) 108 (90 Base) MCG/ACT inhaler Inhale 2 puffs into the lungs every 6 (six) hours as needed for wheezing or shortness of breath.    . Ascorbic Acid (VITAMIN C) 100 MG tablet Take 100 mg by mouth daily.    Marland Kitchen aspirin 81 MG tablet Take 81 mg by mouth daily.    . cholecalciferol (VITAMIN D) 1000 UNITS tablet Take 1,000 Units by mouth daily.    Marland Kitchen diltiazem (CARDIZEM CD) 360 MG 24 hr capsule Take 1 capsule (360 mg total) by mouth every morning.    . Lactobacillus (ACIDOPHILUS PO) Take 1 capsule by mouth daily.    . Multiple Vitamins-Minerals (OSTEO COMPLEX PO) Take 1 tablet by mouth daily.     . polyethylene glycol (MIRALAX / GLYCOLAX) packet Take 17 g by mouth daily as needed for moderate constipation. 14 each 0  .  Propylene Glycol (SYSTANE BALANCE) 0.6 % SOLN Apply 0.6 % to eye 2 (two) times daily.    Marland Kitchen senna-docusate (SENOKOT-S) 8.6-50 MG tablet Take 1 tablet by mouth 2 (two) times daily.    Marland Kitchen venlafaxine XR (EFFEXOR XR) 75 MG 24 hr capsule Take 75 mg by mouth daily with breakfast.    . vitamin B-12 (CYANOCOBALAMIN) 100 MCG tablet Take 100 mcg by mouth daily.     No current facility-administered medications for this visit.     Allergies: Allergies  Allergen Reactions  . Shellfish Allergy     Hot and breaks out  . Tape Rash and Other (See Comments)    Skin is very thin!!    Social History: The patient  reports that she has quit smoking. She has never used smokeless tobacco. She reports that she does not drink alcohol or use drugs.     Family History: The patient's family history includes Breast cancer in her sister; Heart disease in her mother.   Review of Systems: Please see the history of present illness.   Otherwise, the review of systems is positive for none.   All other systems are reviewed and negative.   Physical Exam: VS:  BP 110/70   Pulse 89   Ht 4\' 9"  (1.448 m)   Wt 112 lb (50.8 kg)   BMI 24.24 kg/m  .  BMI Body mass index is 24.24 kg/m.  Wt Readings from Last 3 Encounters:  10/08/16 112 lb (50.8 kg)  09/17/16 112 lb (50.8 kg)  11/11/13 125 lb (56.7 kg)    General: Pleasant. Elderly female who is alert and in no acute distress. Very hard of hearing. States numerous times that "I don't remember".   HEENT: Normal.  Neck: Supple, no JVD, carotid bruits, or masses noted.  Cardiac: Regular rate and rhythm. No murmurs, rubs, or gallops. No edema.  Respiratory:  Lungs are clear to auscultation bilaterally with normal work of breathing.  GI: Soft and nontender.  MS: No deformity or atrophy. Gait and ROM intact.  Skin: Warm and dry. Color is normal.  Neuro:  Strength and sensation are intact and no gross focal deficits noted.  Psych: Alert, appropriate and with normal affect.   LABORATORY DATA:  EKG:  EKG is ordered today. This demonstrates NSR today. Inferior Q's. Poor R wave progression.   Lab Results  Component Value Date   WBC 9.2 09/17/2016   HGB 11.6 (L) 09/17/2016   HCT 34.6 (L) 09/17/2016   PLT 307 09/17/2016   GLUCOSE 89 09/17/2016   ALT 17 09/11/2016   AST 30 09/11/2016   NA 135 09/17/2016   K 4.1 09/17/2016   CL 102 09/17/2016   CREATININE 0.54 09/17/2016   BUN 7 09/17/2016   CO2 23 09/17/2016   TSH 2.903 09/12/2016    BNP (last 3 results)  Recent Labs  09/14/16 0505  BNP 236.7*    ProBNP (last 3 results) No results for input(s): PROBNP in the last 8760 hours.   Other Studies Reviewed Today:  Echo Study Conclusions 09/2016  - Left ventricle: The cavity size was  normal. There was mild focal   basal hypertrophy of the septum. Indeterminant diastolic function   (atrial fibrillation). Systolic function was normal. The   estimated ejection fraction was in the range of 55% to 60%. Wall   motion was normal; there were no regional wall motion   abnormalities. - Aortic valve: There was no stenosis. - Mitral valve: Moderately calcified annulus. There  was mild   regurgitation. - Left atrium: The atrium was mildly dilated. - Right ventricle: The cavity size was normal. Systolic function   was mildly reduced. - Tricuspid valve: There was moderate regurgitation. Peak RV-RA   gradient (S): 35 mm Hg. - Pulmonary arteries: PA peak pressure: 43 mm Hg (S). - Systemic veins: IVC measured 2.0 cm with < 50% respirophasic   variation, suggesting RA pressure 8 mmHg. - Pericardium, extracardiac: A trivial pericardial effusion was   identified.  Impressions:  - The patient was in atrial fibrillation. Normal LV size with mild   focal basal septal hypertrophy. EF 55-60%. Normal RV size with   mildly decreased systolic function. Mild mitral regurgitation.   Moderate tricuspid regurgitation. Mild pulmonary hypertension.   Assessment/Plan:  1. Atrial fib - most likely triggered by recent pneumonia. She is in NSR today. I have left her on her current regimen. Not felt to be a good candidate for anticoagulation. She is moving to assisted living.   2. Recent pneumonia - improved.   3. Interstitial lung disease - chronic dyspnea - unchanged.   4. HTN - BP looks good on current regimen.  5. HLD - no recent values - not on therapy.  Would not start given advanced age.   6. Advanced age - moving to assisted living soon.   Current medicines are reviewed with the patient today.  The patient does not have concerns regarding medicines other than what has been noted above.  The following changes have been made:  See above.  Labs/ tests ordered today include:     Orders Placed This Encounter  Procedures  . EKG 12-Lead     Disposition:   FU prn per daughter's wishes. We will be available as needed if problems arise and otherwise defer her management to the house staff.   Patient is agreeable to this plan and will call if any problems develop in the interim.   SignedNorma Fredrickson: Mauriana Dann, NP  10/08/2016 4:07 PM  [facility] [address] [place], [place]  [address] Phone: [phone] Fax: [phone]

## 2016-10-08 NOTE — Telephone Encounter (Signed)
Faxing to [facility]  @ [phone] after calling facility @ [phone]. Pt is moving to assisted living as of February 1. Will fax ov note to number above.

## 2016-11-12 ENCOUNTER — Telehealth: Payer: Self-pay | Admitting: Nurse Practitioner

## 2016-11-12 ENCOUNTER — Telehealth: Payer: Self-pay | Admitting: *Deleted

## 2016-11-12 NOTE — Telephone Encounter (Signed)
Fax to [facility]

## 2016-11-12 NOTE — Telephone Encounter (Signed)
Received correspondence from [facility] from the pharmacist regarding her medicines.   Apparently now on metoprolol 12.$RemoveBeforNorth [facility] CentereDEID_[id number]$5mgis patient has not been seen since her original visit here back in January of 2018. I have sent back note saying that she was not to be on beta blocker due to pulmonary issues and that her dose of CCB is 360 mg.   Based on our data, would continue with our current plan of care.   Rosalio MacadamiaLori C. Javares Kaufhold, RN, ANP-C [facility] [address] [place], [place]  [address] [phone]

## 2016-12-06 ENCOUNTER — Telehealth: Payer: Self-pay | Admitting: Nurse Practitioner

## 2016-12-06 NOTE — Telephone Encounter (Signed)
New Message  GraceNikki voiced she was calling in regards to orders she some questions about.  Please f/u

## 2016-12-06 NOTE — Telephone Encounter (Signed)
Returned call to [facility] at [facility].She stated patient is now in their assisted living.Pharmacist was reviewing her medications.She wanted to clarify if patient is to be on metoprolol 12.5 mg twice a day and cardizem 360 mg daily.Stated Lawson FiscalLori Gerhardt's 10/08/16 note says she should not take any beta blockers due to pulmonary issues.B/P has been ranging 110/63,124/74,101/59 pulse 66 to 86.Advised I will send message to Dr.Skains for advice.

## 2016-12-06 NOTE — Telephone Encounter (Signed)
Okay to stop metoprolol 12.5 mg twice a day. Continue with Cardizem. Donato SchultzMark Skains, MD

## 2016-12-06 NOTE — Telephone Encounter (Signed)
Returned call to [place] with [facility].Dr.Skains advice given.

## 2017-07-08 ENCOUNTER — Other Ambulatory Visit: Payer: Self-pay | Admitting: Orthopedic Surgery

## 2017-07-08 DIAGNOSIS — S32010A Wedge compression fracture of first lumbar vertebra, initial encounter for closed fracture: Secondary | ICD-10-CM

## 2017-07-10 ENCOUNTER — Other Ambulatory Visit: Payer: Self-pay | Admitting: Orthopedic Surgery

## 2017-07-10 ENCOUNTER — Ambulatory Visit
Admission: RE | Admit: 2017-07-10 | Discharge: 2017-07-10 | Disposition: A | Discharge status: Home / Self Care | Payer: Self-pay | Source: Ambulatory Visit | Attending: Orthopedic Surgery | Admitting: Orthopedic Surgery

## 2017-07-10 ENCOUNTER — Other Ambulatory Visit (HOSPITAL_COMMUNITY): Payer: Self-pay | Admitting: Interventional Radiology

## 2017-07-10 ENCOUNTER — Ambulatory Visit
Admission: RE | Admit: 2017-07-10 | Discharge: 2017-07-10 | Disposition: A | Discharge status: Home / Self Care | Payer: Medicare Other | Source: Ambulatory Visit | Attending: Orthopedic Surgery | Admitting: Orthopedic Surgery

## 2017-07-10 DIAGNOSIS — S32010A Wedge compression fracture of first lumbar vertebra, initial encounter for closed fracture: Secondary | ICD-10-CM

## 2017-07-10 DIAGNOSIS — M4850XA Collapsed vertebra, not elsewhere classified, site unspecified, initial encounter for fracture: Secondary | ICD-10-CM

## 2017-07-10 HISTORY — PX: IR RADIOLOGIST EVAL & MGMT: IMG5224

## 2017-07-10 NOTE — Consult Note (Signed)
Chief Complaint: I have terrible back pain.   Referring Physician(s): Dumonski,Mark  History of Present Illness: Savannah Gutierrez is a 83 y.o. female presenting today as a scheduled consultation for lower back pain, kindly referred by Dr. Yevette Edwards.    She is here with 2 of her sons for the interview. The history is provided by both her sons and herself.   They tell me that she has been having worsening back pain over the past 2 months associated with escalation of use of pain medications.  She has not had a recent fall, with the only fall in her recent history about 10 months ago in December of 2017.  At that time she was treated for a left wrist injury, was hospitalized with pneumonia, and was not treated at the time for significant back injury.  She has a history of falls.   Shortly after that event, she began living at Assisted Living arrangement in [place], [place], at [facility].  Over the past 2 months, the pain has become 10/10 intensity, most days of the week, and has severely restricted her daily activities.    She has had prior vertebral augmentation with good result.   I discussed vertebral augmentation, including risk benefit analysis.  Risks include: bleeding, infection, local injury, nerve injury, need for further procedure/surgery, ongoing pain, cardiopulmonary collapse, death.    .    Past Medical History:  Diagnosis Date  . Anxiety   . Bronchitis   . Bursitis of hip   . Complication of anesthesia    I  HAD DIFFICULTY WAKING ONE TIME "  . Depression   . GERD (gastroesophageal reflux disease)   . Hypercholesteremia   . Hypertension   . Memory loss   . Pneumonia 09/11/2016  . Shortness of breath     Past Surgical History:  Procedure Laterality Date  . CATARACT EXTRACTION    . CHOLECYSTECTOMY    . KNEE SURGERY      Allergies: Shellfish allergy and Tape  Medications: Prior to Admission medications   Medication Sig Start Date End Date Taking?  Authorizing Provider  Ascorbic Acid (VITAMIN C) 100 MG tablet Take 100 mg by mouth daily.   Yes [provider]  aspirin 81 MG tablet Take 81 mg by mouth daily.   Yes [provider]  celecoxib (CELEBREX) 100 MG capsule Take 100 mg by mouth 2 (two) times daily.   Yes [provider]  cholecalciferol (VITAMIN D) 1000 UNITS tablet Take 1,000 Units by mouth daily.   Yes [provider]  diltiazem (CARDIZEM CD) 360 MG 24 hr capsule Take 1 capsule (360 mg total) by mouth every morning. 09/17/16  Yes Rai, Ripudeep K, MD  fluticasone (FLONASE) 50 MCG/ACT nasal spray Place into both nostrils daily.   Yes [provider]  Multiple Vitamins-Minerals (OSTEO COMPLEX PO) Take 1 tablet by mouth daily.    Yes [provider]  polyethylene glycol (MIRALAX / GLYCOLAX) packet Take 17 g by mouth daily as needed for moderate constipation. 09/17/16  Yes Rai, Ripudeep K, MD  Propylene Glycol (SYSTANE BALANCE) 0.6 % SOLN Apply 0.6 % to eye 2 (two) times daily.   Yes [provider]  senna-docusate (SENOKOT-S) 8.6-50 MG tablet Take 1 tablet by mouth 2 (two) times daily. 09/17/16  Yes Rai, Ripudeep K, MD  tizanidine (ZANAFLEX) 2 MG capsule Take 2 mg by mouth 3 (three) times daily.   Yes [provider]  traMADol (ULTRAM) 50 MG tablet  Take by mouth every 6 (six) hours as needed.   Yes [provider]  venlafaxine XR (EFFEXOR XR) 75 MG 24 hr capsule Take 75 mg by mouth daily with breakfast.   Yes [provider]  vitamin B-12 (CYANOCOBALAMIN) 100 MCG tablet Take 100 mcg by mouth daily.   Yes [provider]  acetaminophen (TYLENOL) 325 MG tablet Take 2 tablets (650 mg total) by mouth every 6 (six) hours as needed for mild pain, fever or headache. Patient not taking: Reported on 07/10/2017 09/17/16   Rai, Delene Ruffiniipudeep K, MD  albuterol (VENTOLIN HFA) 108 (90 Base) MCG/ACT inhaler Inhale 2 puffs into the lungs every 6 (six) hours as needed for  wheezing or shortness of breath.    [provider]  Lactobacillus (ACIDOPHILUS PO) Take 1 capsule by mouth daily.    [provider]     Family History  Problem Relation Age of Onset  . Heart disease Mother        unknown type of heart disease later in life  . Breast cancer Sister     Social History   Social History  . Marital status: Widowed    Spouse name: N/A  . Number of children: N/A  . Years of education: N/A   Social History Main Topics  . Smoking status: Former Games developermoker  . Smokeless tobacco: Never Used  . Alcohol use No  . Drug use: No  . Sexual activity: Not on file   Other Topics Concern  . Not on file   Social History Narrative  . No narrative on file       Review of Systems: A 12 point ROS discussed and pertinent positives are indicated in the HPI above.  All other systems are negative.  Review of Systems  Vital Signs: BP (!) 149/76   Pulse 95   Temp 97.7 F (36.5 C) (Oral)   Resp 14   Ht 5' (1.524 m)   Wt 105 lb (47.6 kg)   SpO2 96%   BMI 20.51 kg/m   Physical Exam General: 83 yo female appearing older than stated age.  Well-developed, well-nourished.  No distress. HEENT: Atraumatic, normocephalic.  Conjugate gaze, extra-ocular motor intact. No scleral icterus or scleral injection. No lesions on external ears, nose, lips, or gums.  Oral mucosa moist, pink.   Back: focal severe reproducible pain at the lumbar levels, midline Chest/Lungs:  Symmetric chest with inspiration/expiration.  No labored breathing.   iated. No JVD appreciated.  Abdomen:  Soft, NT/ND, with + bowel sounds.   Genito-urinary: Deferred Neurologic: Alert & Oriented to person, place, and time.   Normal affect and insight.  Appropriate questions.  Moving all 4 extremities with gross sensory intact.   Extremities: moving all 4 extremities. Gait:  Slow gain using a walker secondary to pain/unsteadyness.  Mallampati Score:  3  Imaging: Mr Outside Films  Spine  Result Date: 07/10/2017 This examination belongs to an outside facility and is stored here for comparison purposes only.  Contact the originating outside institution for any associated report or interpretation.   Labs:  CBC:  Recent Labs  09/13/16 0245 09/14/16 0505 09/15/16 0452 09/17/16 0625  WBC 8.1 9.2 9.4 9.2  HGB 10.6* 10.6* 10.9* 11.6*  HCT 32.9* 32.3* 33.6* 34.6*  PLT 237 267 292 307    COAGS: No results for input(s): INR, APTT in the last 8760 hours.  BMP:  Recent Labs  09/13/16 0245 09/14/16 0505 09/15/16 0452 09/17/16 0625  NA 134* 135  136 135  K 3.7 3.3* 3.9 4.1  CL 104 102 102 102  CO2 24 26 26 23   GLUCOSE 102* 97 96 89  BUN <5* 5* 6 7  CALCIUM 8.7* 8.5* 8.9 9.0  CREATININE 0.55 0.58 0.56 0.54  GFRNONAA >60 >60 >60 >60  GFRAA >60 >60 >60 >60    LIVER FUNCTION TESTS:  Recent Labs  09/11/16 1120  BILITOT 1.6*  AST 30  ALT 17  ALKPHOS 98  PROT 7.0  ALBUMIN 3.3*    TUMOR MARKERS: No results for input(s): AFPTM, CEA, CA199, CHROMGRNA in the last 8760 hours.  Assessment and Plan:  Ms Grieshop is a 83 year old female presenting today for evaluation of new L2 and L3 vertebral body fractures, with her risk being osteopenia.    She has 10/10 pain most day of the week, which is very severely limiting her daily activity.   My impression is that she is an excellent candidate for kyphoplasty vertebral augmentation.    I discussed with her the treatment options, with vertebral augmentation a reasonable option.  We would hope to improve her pain significantly, though perhaps not completely.    She would like to proceed ASAP with the first available VIR physician.   Plan: - Insurance approval for vertebral augmentation - Plan to proceed with kyphoplasty first available.  - she is not taking any blood thinners.  Thank you for this interesting consult.  I greatly enjoyed meeting Savannah Gutierrez and look forward to participating in their  care.  A copy of this report was sent to the requesting provider on this date.  Electronically Signed: Gilmer Mor 07/10/2017, 9:29 AM   I spent a total of  40 Minutes   in face to face in clinical consultation, greater than 50% of which was counseling/coordinating care for L2 and L3 fracture, possible kyphoplasty

## 2017-07-11 ENCOUNTER — Other Ambulatory Visit: Payer: Self-pay | Admitting: Student

## 2017-07-11 ENCOUNTER — Other Ambulatory Visit: Payer: Self-pay | Admitting: Radiology

## 2017-07-12 ENCOUNTER — Other Ambulatory Visit (HOSPITAL_COMMUNITY): Payer: Self-pay | Admitting: Interventional Radiology

## 2017-07-12 ENCOUNTER — Ambulatory Visit (HOSPITAL_COMMUNITY)
Admission: RE | Admit: 2017-07-12 | Discharge: 2017-07-12 | Disposition: A | Discharge status: Home / Self Care | Payer: Medicare Other | Source: Ambulatory Visit | Attending: Interventional Radiology | Admitting: Interventional Radiology

## 2017-07-12 ENCOUNTER — Encounter (HOSPITAL_COMMUNITY): Payer: Self-pay

## 2017-07-12 DIAGNOSIS — Z7951 Long term (current) use of inhaled steroids: Secondary | ICD-10-CM | POA: Insufficient documentation

## 2017-07-12 DIAGNOSIS — K219 Gastro-esophageal reflux disease without esophagitis: Secondary | ICD-10-CM | POA: Insufficient documentation

## 2017-07-12 DIAGNOSIS — Z7982 Long term (current) use of aspirin: Secondary | ICD-10-CM | POA: Insufficient documentation

## 2017-07-12 DIAGNOSIS — F329 Major depressive disorder, single episode, unspecified: Secondary | ICD-10-CM | POA: Diagnosis not present

## 2017-07-12 DIAGNOSIS — M4850XA Collapsed vertebra, not elsewhere classified, site unspecified, initial encounter for fracture: Secondary | ICD-10-CM

## 2017-07-12 DIAGNOSIS — I10 Essential (primary) hypertension: Secondary | ICD-10-CM | POA: Diagnosis not present

## 2017-07-12 DIAGNOSIS — Z87891 Personal history of nicotine dependence: Secondary | ICD-10-CM | POA: Insufficient documentation

## 2017-07-12 DIAGNOSIS — M4856XA Collapsed vertebra, not elsewhere classified, lumbar region, initial encounter for fracture: Secondary | ICD-10-CM | POA: Insufficient documentation

## 2017-07-12 DIAGNOSIS — E7800 Pure hypercholesterolemia, unspecified: Secondary | ICD-10-CM | POA: Diagnosis not present

## 2017-07-12 DIAGNOSIS — F419 Anxiety disorder, unspecified: Secondary | ICD-10-CM | POA: Insufficient documentation

## 2017-07-12 HISTORY — PX: IR KYPHO LUMBAR INC FX REDUCE BONE BX UNI/BIL CANNULATION INC/IMAGING: IMG5519

## 2017-07-12 HISTORY — PX: IR KYPHO EA ADDL LEVEL THORACIC OR LUMBAR: IMG5520

## 2017-07-12 LAB — BASIC METABOLIC PANEL
ANION GAP: 9 (ref 5–15)
BUN: 13 mg/dL (ref 6–20)
CO2: 26 mmol/L (ref 22–32)
Calcium: 9.6 mg/dL (ref 8.9–10.3)
Chloride: 103 mmol/L (ref 101–111)
Creatinine, Ser: 0.59 mg/dL (ref 0.44–1.00)
Glucose, Bld: 98 mg/dL (ref 65–99)
Potassium: 3.6 mmol/L (ref 3.5–5.1)
SODIUM: 138 mmol/L (ref 135–145)

## 2017-07-12 LAB — CBC
HCT: 39.9 % (ref 36.0–46.0)
HEMOGLOBIN: 13.1 g/dL (ref 12.0–15.0)
MCH: 30 pg (ref 26.0–34.0)
MCHC: 32.8 g/dL (ref 30.0–36.0)
MCV: 91.5 fL (ref 78.0–100.0)
Platelets: 246 10*3/uL (ref 150–400)
RBC: 4.36 MIL/uL (ref 3.87–5.11)
RDW: 14.1 % (ref 11.5–15.5)
WBC: 6.3 10*3/uL (ref 4.0–10.5)

## 2017-07-12 LAB — APTT: APTT: 34 s (ref 24–36)

## 2017-07-12 LAB — PROTIME-INR
INR: 0.99
PROTHROMBIN TIME: 13 s (ref 11.4–15.2)

## 2017-07-12 MED ORDER — CEFAZOLIN SODIUM-DEXTROSE 2-4 GM/100ML-% IV SOLN
2.0000 g | INTRAVENOUS | Status: AC
  Administered 2017-07-12: 2 g via INTRAVENOUS

## 2017-07-12 MED ORDER — LIDOCAINE HCL 1 % IJ SOLN
INTRAMUSCULAR | Status: AC
  Filled 2017-07-12: qty 20

## 2017-07-12 MED ORDER — FENTANYL CITRATE (PF) 100 MCG/2ML IJ SOLN
INTRAMUSCULAR | Status: AC | PRN
  Administered 2017-07-12: 25 ug via INTRAVENOUS
  Administered 2017-07-12 (×3): 12.5 ug via INTRAVENOUS

## 2017-07-12 MED ORDER — SODIUM CHLORIDE 0.9 % IV SOLN: INTRAVENOUS | Status: DC

## 2017-07-12 MED ORDER — IOPAMIDOL (ISOVUE-300) INJECTION 61%
INTRAVENOUS | Status: AC
  Administered 2017-07-12: 30 mL
  Filled 2017-07-12: qty 50

## 2017-07-12 MED ORDER — FENTANYL CITRATE (PF) 100 MCG/2ML IJ SOLN
INTRAMUSCULAR | Status: AC
  Filled 2017-07-12: qty 4

## 2017-07-12 MED ORDER — MIDAZOLAM HCL 2 MG/2ML IJ SOLN
INTRAMUSCULAR | Status: AC
  Filled 2017-07-12: qty 4

## 2017-07-12 MED ORDER — CEFAZOLIN SODIUM-DEXTROSE 2-4 GM/100ML-% IV SOLN
INTRAVENOUS | Status: AC
  Filled 2017-07-12: qty 100

## 2017-07-12 MED ORDER — MIDAZOLAM HCL 2 MG/2ML IJ SOLN
INTRAMUSCULAR | Status: AC | PRN
  Administered 2017-07-12 (×2): 0.5 mg via INTRAVENOUS
  Administered 2017-07-12: 1 mg via INTRAVENOUS

## 2017-07-12 MED ORDER — LIDOCAINE HCL (PF) 1 % IJ SOLN
INTRAMUSCULAR | Status: AC | PRN
  Administered 2017-07-12: 40 mL
  Administered 2017-07-12: 10 mL
  Administered 2017-07-12: 30 mL

## 2017-07-12 NOTE — Procedures (Signed)
Interventional Radiology Procedure Note  Procedure: Vertebral augmentation of the symptomatic L2 and L3 compression fracture, with bipedicular approach at each level.    Complications: None Recommendations:  - 15 minutes in room until transfer - 3 hour recovery with DC at ~1:30pm - bedrest for 3 hours - Routine wound care - OTC's for any post op pain - advance diet - Do not submerge for 7 days - follow up with Dr. Loreta AveWagner in ~4 weeks  Signed,  Yvone NeuJaime S. Loreta AveWagner, DO

## 2017-07-12 NOTE — Discharge Instructions (Addendum)
°Percutaneous Vertebroplasty, Care After °Refer to this sheet in the next few weeks. These instructions provide you with information on caring for yourself after your procedure. Your health care provider may also give you more specific instructions. Your treatment has been planned according to current medical practices, but problems sometimes occur. Call your health care provider if you have any problems or questions after your procedure. °What can I expect after the procedure? °· You may have discomfort in your back at the site of the procedure. °· You may have immediate relief of the back pain from the collapsed bones of the spine (compression fracture), or it may lessen over the next 2 days. °· Pain resulting from a percutaneous vertebroplasty will usually go away in 2-3 days. °Follow these instructions at home: °· Take pain medicine as directed by your health care provider. °· Keep the incision site dry and covered for 24 hours, or as told by your health care provider. Ask your health care provider when you may remove your bandage, as well as when you can bathe or shower. °· An ice pack can be placed on the incision site to help lessen pain and swelling after the procedure: °? Put ice in a plastic bag. °? Place a towel between your skin and the bag. °? Leave the ice on for 20 minutes, 2-3 times a day. °· Rest for 24 hours after the procedure or as told by your health care provider. °· Return to normal activity as told by your health care provider. °· Ask what type of stretching and strengthening exercises you should do after the procedure. °· Do not bend or lift anything heavy. Follow your health care provider's instructions about bending and lifting. °Contact a health care provider if: °· Your incision site becomes red, swollen, or tender to the touch. °· You have any type of bleeding or fluid leakage from the incision site. °· You become nauseous or vomit for more than 24 hours after the procedure. °· Your  back pain does not get better. °· You have a fever. °Get help right away if: °· You have sudden, severe back pain. °· You notice a change in your ability to control urination or bowel movements. °· You have tingling, numbness, or weakness in your legs or feet after the procedure that was not there before. °· You have sudden weakness in your arms or legs. °· You have shooting pain down your legs. °· You have chest pain, trouble breathing, or are short of breath. °· You feel dizzy or faint. °· You have changes in your speech or vision. °This information is not intended to replace advice given to you by your health care provider. Make sure you discuss any questions you have with your health care provider. °Document Released: 04/26/2011 Document Revised: 02/02/2016 Document Reviewed: 05/04/2013 °Elsevier Interactive Patient Education © 2017 [employer] ° ° °Balloon Kyphoplasty, Care After °Refer to this sheet in the next few weeks. These instructions provide you with information about caring for yourself after your procedure. Your health care provider may also give you more specific instructions. Your treatment has been planned according to current medical practices, but problems sometimes occur. Call your health care provider if you have any problems or questions after your procedure. °What can I expect after the procedure? °After your procedure, it is common to have back pain. °Follow these instructions at home: °Incision care °· Follow instructions from your health care provider about how to take care of your incisions. Make   sure you: °? Wash your hands with soap and water before you change your bandage (dressing). If soap and water are not available, use hand sanitizer. °? Change your dressing as told by your health care provider. °? Leave stitches (sutures), skin glue, or adhesive strips in place. These skin closures may need to be in place for 2 weeks or longer. If adhesive strip edges start to loosen and curl  up, you may trim the loose edges. Do not remove adhesive strips completely unless your health care provider tells you to do that. °· Check your incision area every day for signs of infection. Watch for: °? Redness, swelling, or pain. °? Fluid, blood, or pus. °· Keep your dressing dry until your health care provider says that it can be removed. °Activity ° °· Rest your back and avoid intense physical activity for as long as told by your health care provider. °· Return to your normal activities as told by your health care provider. Ask your health care provider what activities are safe for you. °· Do not lift anything that is heavier than 10 lb (4.5 kg). This is about the weight of a gallon of milk. You may need to avoid heavy lifting for several weeks. °General instructions °· Take over-the-counter and prescription medicines only as told by your health care provider. °· If directed, apply ice to the painful area: °? Put ice in a plastic bag. °? Place a towel between your skin and the bag. °? Leave the ice on for 20 minutes, 2-3 times per day. °· Do not use tobacco products, including cigarettes, chewing tobacco, or e-cigarettes. If you need help quitting, ask your health care provider. °· Keep all follow-up visits as told by your health care provider. This is important. °Contact a health care provider if: °· You have a fever. °· You have redness, swelling, or pain at the site of your incisions. °· You have fluid, blood, or pus coming from your incisions. °· You have pain that gets worse or does not get better with medicine. °· You develop numbness or weakness in any part of your body. °Get help right away if: °· You have chest pain. °· You have difficulty breathing. °· You cannot move your legs. °· You cannot control your bladder or bowel movements. °· You suddenly become weak or numb on one side of your body. °· You become very confused. °· You have trouble speaking or understanding, or both. °This information is  not intended to replace advice given to you by your health care provider. Make sure you discuss any questions you have with your health care provider. °Document Released: 05/18/2015 Document Revised: 02/02/2016 Document Reviewed: 12/20/2014 °Elsevier Interactive Patient Education © 2018 [employer] °Moderate Conscious Sedation, Adult, Care After °These instructions provide you with information about caring for yourself after your procedure. Your health care provider may also give you more specific instructions. Your treatment has been planned according to current medical practices, but problems sometimes occur. Call your health care provider if you have any problems or questions after your procedure. °What can I expect after the procedure? °After your procedure, it is common: °· To feel sleepy for several hours. °· To feel clumsy and have poor balance for several hours. °· To have poor judgment for several hours. °· To vomit if you eat too soon. ° °Follow these instructions at home: °For at least 24 hours after the procedure: ° °· Do not: °? Participate in activities where you could fall or   become injured. °? Drive. °? Use heavy machinery. °? Drink alcohol. °? Take sleeping pills or medicines that cause drowsiness. °? Make important decisions or sign legal documents. °? Take care of children on your own. °· Rest. °Eating and drinking °· Follow the diet recommended by your health care provider. °· If you vomit: °? Drink water, juice, or soup when you can drink without vomiting. °? Make sure you have little or no nausea before eating solid foods. °General instructions °· Have a responsible adult stay with you until you are awake and alert. °· Take over-the-counter and prescription medicines only as told by your health care provider. °· If you smoke, do not smoke without supervision. °· Keep all follow-up visits as told by your health care provider. This is important. °Contact a health care provider if: °· You keep  feeling nauseous or you keep vomiting. °· You feel light-headed. °· You develop a rash. °· You have a fever. °Get help right away if: °· You have trouble breathing. °This information is not intended to replace advice given to you by your health care provider. Make sure you discuss any questions you have with your health care provider. °Document Released: 06/17/2013 Document Revised: 01/30/2016 Document Reviewed: 12/17/2015 °Elsevier Interactive Patient Education © 2018 [employer] ° °

## 2017-07-12 NOTE — H&P (View-Only) (Signed)
Chief Complaint: I have terrible back pain.   Referring Physician(s): Dumonski,Mark  History of Present Illness: Savannah Gutierrez is a 83 y.o. female presenting today as a scheduled consultation for lower back pain, kindly referred by Dr. Yevette Edwards.    She is here with 2 of her sons for the interview. The history is provided by both her sons and herself.   They tell me that she has been having worsening back pain over the past 2 months associated with escalation of use of pain medications.  She has not had a recent fall, with the only fall in her recent history about 10 months ago in December of 2017.  At that time she was treated for a left wrist injury, was hospitalized with pneumonia, and was not treated at the time for significant back injury.  She has a history of falls.   Shortly after that event, she began living at Assisted Living arrangement in [place], [place], at [facility].  Over the past 2 months, the pain has become 10/10 intensity, most days of the week, and has severely restricted her daily activities.    She has had prior vertebral augmentation with good result.   I discussed vertebral augmentation, including risk benefit analysis.  Risks include: bleeding, infection, local injury, nerve injury, need for further procedure/surgery, ongoing pain, cardiopulmonary collapse, death.    .    Past Medical History:  Diagnosis Date  . Anxiety   . Bronchitis   . Bursitis of hip   . Complication of anesthesia    I  HAD DIFFICULTY WAKING ONE TIME "  . Depression   . GERD (gastroesophageal reflux disease)   . Hypercholesteremia   . Hypertension   . Memory loss   . Pneumonia 09/11/2016  . Shortness of breath     Past Surgical History:  Procedure Laterality Date  . CATARACT EXTRACTION    . CHOLECYSTECTOMY    . KNEE SURGERY      Allergies: Shellfish allergy and Tape  Medications: Prior to Admission medications   Medication Sig Start Date End Date Taking?  Authorizing Provider  Ascorbic Acid (VITAMIN C) 100 MG tablet Take 100 mg by mouth daily.   Yes [provider]  aspirin 81 MG tablet Take 81 mg by mouth daily.   Yes [provider]  celecoxib (CELEBREX) 100 MG capsule Take 100 mg by mouth 2 (two) times daily.   Yes [provider]  cholecalciferol (VITAMIN D) 1000 UNITS tablet Take 1,000 Units by mouth daily.   Yes [provider]  diltiazem (CARDIZEM CD) 360 MG 24 hr capsule Take 1 capsule (360 mg total) by mouth every morning. 09/17/16  Yes Rai, Ripudeep K, MD  fluticasone (FLONASE) 50 MCG/ACT nasal spray Place into both nostrils daily.   Yes [provider]  Multiple Vitamins-Minerals (OSTEO COMPLEX PO) Take 1 tablet by mouth daily.    Yes [provider]  polyethylene glycol (MIRALAX / GLYCOLAX) packet Take 17 g by mouth daily as needed for moderate constipation. 09/17/16  Yes Rai, Ripudeep K, MD  Propylene Glycol (SYSTANE BALANCE) 0.6 % SOLN Apply 0.6 % to eye 2 (two) times daily.   Yes [provider]  senna-docusate (SENOKOT-S) 8.6-50 MG tablet Take 1 tablet by mouth 2 (two) times daily. 09/17/16  Yes Rai, Ripudeep K, MD  tizanidine (ZANAFLEX) 2 MG capsule Take 2 mg by mouth 3 (three) times daily.   Yes [provider]  traMADol (ULTRAM) 50 MG tablet  Take by mouth every 6 (six) hours as needed.   Yes [provider]  venlafaxine XR (EFFEXOR XR) 75 MG 24 hr capsule Take 75 mg by mouth daily with breakfast.   Yes [provider]  vitamin B-12 (CYANOCOBALAMIN) 100 MCG tablet Take 100 mcg by mouth daily.   Yes [provider]  acetaminophen (TYLENOL) 325 MG tablet Take 2 tablets (650 mg total) by mouth every 6 (six) hours as needed for mild pain, fever or headache. Patient not taking: Reported on 07/10/2017 09/17/16   Rai, Delene Ruffiniipudeep K, MD  albuterol (VENTOLIN HFA) 108 (90 Base) MCG/ACT inhaler Inhale 2 puffs into the lungs every 6 (six) hours as needed for  wheezing or shortness of breath.    [provider]  Lactobacillus (ACIDOPHILUS PO) Take 1 capsule by mouth daily.    [provider]     Family History  Problem Relation Age of Onset  . Heart disease Mother        unknown type of heart disease later in life  . Breast cancer Sister     Social History   Social History  . Marital status: Widowed    Spouse name: N/A  . Number of children: N/A  . Years of education: N/A   Social History Main Topics  . Smoking status: Former Games developermoker  . Smokeless tobacco: Never Used  . Alcohol use No  . Drug use: No  . Sexual activity: Not on file   Other Topics Concern  . Not on file   Social History Narrative  . No narrative on file       Review of Systems: A 12 point ROS discussed and pertinent positives are indicated in the HPI above.  All other systems are negative.  Review of Systems  Vital Signs: BP (!) 149/76   Pulse 95   Temp 97.7 F (36.5 C) (Oral)   Resp 14   Ht 5' (1.524 m)   Wt 105 lb (47.6 kg)   SpO2 96%   BMI 20.51 kg/m   Physical Exam General: 83 yo female appearing older than stated age.  Well-developed, well-nourished.  No distress. HEENT: Atraumatic, normocephalic.  Conjugate gaze, extra-ocular motor intact. No scleral icterus or scleral injection. No lesions on external ears, nose, lips, or gums.  Oral mucosa moist, pink.   Back: focal severe reproducible pain at the lumbar levels, midline Chest/Lungs:  Symmetric chest with inspiration/expiration.  No labored breathing.   iated. No JVD appreciated.  Abdomen:  Soft, NT/ND, with + bowel sounds.   Genito-urinary: Deferred Neurologic: Alert & Oriented to person, place, and time.   Normal affect and insight.  Appropriate questions.  Moving all 4 extremities with gross sensory intact.   Extremities: moving all 4 extremities. Gait:  Slow gain using a walker secondary to pain/unsteadyness.  Mallampati Score:  3  Imaging: Mr Outside Films  Spine  Result Date: 07/10/2017 This examination belongs to an outside facility and is stored here for comparison purposes only.  Contact the originating outside institution for any associated report or interpretation.   Labs:  CBC:  Recent Labs  09/13/16 0245 09/14/16 0505 09/15/16 0452 09/17/16 0625  WBC 8.1 9.2 9.4 9.2  HGB 10.6* 10.6* 10.9* 11.6*  HCT 32.9* 32.3* 33.6* 34.6*  PLT 237 267 292 307    COAGS: No results for input(s): INR, APTT in the last 8760 hours.  BMP:  Recent Labs  09/13/16 0245 09/14/16 0505 09/15/16 0452 09/17/16 0625  NA 134* 135  136 135  K 3.7 3.3* 3.9 4.1  CL 104 102 102 102  CO2 24 26 26 23   GLUCOSE 102* 97 96 89  BUN <5* 5* 6 7  CALCIUM 8.7* 8.5* 8.9 9.0  CREATININE 0.55 0.58 0.56 0.54  GFRNONAA >60 >60 >60 >60  GFRAA >60 >60 >60 >60    LIVER FUNCTION TESTS:  Recent Labs  09/11/16 1120  BILITOT 1.6*  AST 30  ALT 17  ALKPHOS 98  PROT 7.0  ALBUMIN 3.3*    TUMOR MARKERS: No results for input(s): AFPTM, CEA, CA199, CHROMGRNA in the last 8760 hours.  Assessment and Plan:  Ms Grieshop is a 83 year old female presenting today for evaluation of new L2 and L3 vertebral body fractures, with her risk being osteopenia.    She has 10/10 pain most day of the week, which is very severely limiting her daily activity.   My impression is that she is an excellent candidate for kyphoplasty vertebral augmentation.    I discussed with her the treatment options, with vertebral augmentation a reasonable option.  We would hope to improve her pain significantly, though perhaps not completely.    She would like to proceed ASAP with the first available VIR physician.   Plan: - Insurance approval for vertebral augmentation - Plan to proceed with kyphoplasty first available.  - she is not taking any blood thinners.  Thank you for this interesting consult.  I greatly enjoyed meeting Savannah Gutierrez and look forward to participating in their  care.  A copy of this report was sent to the requesting provider on this date.  Electronically Signed: Gilmer Mor 07/10/2017, 9:29 AM   I spent a total of  40 Minutes   in face to face in clinical consultation, greater than 50% of which was counseling/coordinating care for L2 and L3 fracture, possible kyphoplasty

## 2017-07-12 NOTE — Interval H&P Note (Signed)
History and Physical Interval Note:  07/12/2017 8:23 AM  Tierre Roby LoftsM Taite  has presented today for kyphoplasty of L2 and L3 as discussed at consultation with Dr. Loreta AveWagner.The various methods of treatment have been discussed with the patient and family. After consideration of risks, benefits and other options for treatment, the patient has consented to kyphoplasty of L2 & L3 compression fractures as a surgical intervention .  The patient's history has been reviewed, patient examined, no change in status, stable for surgery.  I have reviewed the patient's chart and labs.  Questions were answered to the patient's satisfaction.    BP 125/80   Pulse 89   Temp 98.1 F (36.7 C) (Oral)   Resp 16   Ht 5' (1.524 m)   Wt 110 lb (49.9 kg)   SpO2 95%   BMI 21.48 kg/m  Lungs: CTA without w/r/r Heart: Regular   Barbee ShropshireBRUNING, Shyheim Tanney PA-C

## 2017-07-18 ENCOUNTER — Other Ambulatory Visit: Payer: Self-pay | Admitting: Interventional Radiology

## 2017-07-18 DIAGNOSIS — S32010A Wedge compression fracture of first lumbar vertebra, initial encounter for closed fracture: Secondary | ICD-10-CM

## 2017-07-31 ENCOUNTER — Encounter: Payer: Self-pay | Admitting: Interventional Radiology

## 2017-08-21 ENCOUNTER — Ambulatory Visit
Admission: RE | Admit: 2017-08-21 | Discharge: 2017-08-21 | Disposition: A | Discharge status: Home / Self Care | Payer: Medicare Other | Source: Ambulatory Visit | Attending: Interventional Radiology | Admitting: Interventional Radiology

## 2017-08-21 DIAGNOSIS — S32010A Wedge compression fracture of first lumbar vertebra, initial encounter for closed fracture: Secondary | ICD-10-CM

## 2017-08-21 HISTORY — PX: IR RADIOLOGIST EVAL & MGMT: IMG5224

## 2017-08-21 NOTE — Progress Notes (Signed)
Chief Complaint: I had a spine fracture  Referring Physician(s): Dr. Estill BambergMark Dumonski  History of Present Illness: Savannah Roby LoftsM Klindt is a 83 y.o. female presenting as a scheduled follow up to VIR clinic, SP treatment of L2 and L3 compression fracture with kyphoplasty 07/12/2017.    [facility], and discharged the same day.  She returned to her independent living facility [facility].    She and her son tell me today that since we treated her, she has had 2 more falls at her facility.  The first occurred within 2 weeks after our treatment, and the most recent occurred last Monday.  They tell me that she has had 2 sets of xrays performed at her doctors office and at the facility, with negative result.  We cannot, however, view these images.     She is very confident that our procedure gave her relief of pain, and she expresses that we have helped her achieve pain relief.  It is difficult to determine whether her additional falls are masking any on-going/residual pain, or whether her current discomfort is from the recent falls.    She describes some left lower flank and buttocks pain currently, for which she continues to take usually 1 tramadol per day.  This gives her relief and allows her to participate with her tasks of daily living and all of the rehab activities that she has been assigned.  She also is beginning to participate in "sit and fit" at her center.  Pain does not restrict her.   We also discussed her change in bowel habits, with usual frequency of a bowel movement every 2-3 days.  She has been symptomatic in the past with nausea secondary to constipation, and she is also prescribed senokot to help with regularity.  We discussed the need to continue the senokot when taking pain meds.     Past Medical History:  Diagnosis Date  . Anxiety   . Bronchitis   . Bursitis of hip   . Complication of anesthesia    I  HAD DIFFICULTY  WAKING ONE TIME "  . Depression   . GERD (gastroesophageal reflux disease)   . Hypercholesteremia   . Hypertension   . Memory loss   . Pneumonia 09/11/2016  . Shortness of breath     Past Surgical History:  Procedure Laterality Date  . CATARACT EXTRACTION    . CHOLECYSTECTOMY    . IR KYPHO EA ADDL LEVEL THORACIC OR LUMBAR  07/12/2017  . IR KYPHO LUMBAR INC FX REDUCE BONE BX UNI/BIL CANNULATION INC/IMAGING  07/12/2017  . IR RADIOLOGIST EVAL & MGMT  07/10/2017  . IR RADIOLOGIST EVAL & MGMT  08/21/2017  . KNEE SURGERY      Allergies: Shellfish allergy and Tape  Medications: Prior to Admission medications   Medication Sig Start Date End Date Taking? Authorizing Provider  albuterol (VENTOLIN HFA) 108 (90 Base) MCG/ACT inhaler Inhale 2 puffs into the lungs every 6 (six) hours as needed for wheezing or shortness of breath.    [provider]  aspirin EC 81 MG tablet Take 81 mg by mouth daily.    [provider]  celecoxib (CELEBREX) 200 MG capsule Take 200 mg by mouth daily.    [provider]  cholecalciferol (VITAMIN D) 1000 UNITS tablet Take 1,000 Units by mouth daily.    [provider]  diltiazem (CARDIZEM CD) 360 MG 24 hr capsule Take 1 capsule (360  mg total) by mouth every morning. 09/17/16   Rai, Delene Ruffini, MD  fluticasone (FLONASE) 50 MCG/ACT nasal spray Place 2 sprays into both nostrils daily as needed for allergies.     [provider]  Lactobacillus (ACIDOPHILUS PO) Take 1 capsule by mouth daily.    [provider]  Multiple Vitamin (MULTIVITAMIN WITH MINERALS) TABS tablet Take 1 tablet by mouth daily.    [provider]  PHOSPHATIDYLSERINE PO Take 200 mg by mouth daily.    [provider]  polyethylene glycol (MIRALAX / GLYCOLAX) packet Take 17 g by mouth daily as needed for moderate constipation. 09/17/16   Rai, Ripudeep K, MD  Propylene Glycol (SYSTANE BALANCE) 0.6 % SOLN Place 1 drop into both eyes 2  (two) times daily.     [provider]  senna-docusate (SENOKOT-S) 8.6-50 MG tablet Take 1 tablet by mouth 2 (two) times daily. Patient taking differently: Take 1 tablet by mouth daily.  09/17/16   Rai, Delene Ruffini, MD  tizanidine (ZANAFLEX) 2 MG capsule Take 2 mg by mouth every 6 (six) hours as needed for muscle spasms.     [provider]  traMADol (ULTRAM) 50 MG tablet Take 50 mg by mouth every 6 (six) hours as needed (for back pain.).     [provider]  venlafaxine XR (EFFEXOR XR) 75 MG 24 hr capsule Take 75 mg by mouth daily with breakfast.    [provider]  vitamin B-12 (CYANOCOBALAMIN) 100 MCG tablet Take 100 mcg by mouth daily.    [provider]  vitamin C (ASCORBIC ACID) 500 MG tablet Take 500 mg by mouth daily.    [provider]     Family History  Problem Relation Age of Onset  . Heart disease Mother        unknown type of heart disease later in life  . Breast cancer Sister     Social History   Socioeconomic History  . Marital status: Widowed    Spouse name: Not on file  . Number of children: Not on file  . Years of education: Not on file  . Highest education level: Not on file  Social Needs  . Financial resource strain: Not on file  . Food insecurity - worry: Not on file  . Food insecurity - inability: Not on file  . Transportation needs - medical: Not on file  . Transportation needs - non-medical: Not on file  Occupational History  . Not on file  Tobacco Use  . Smoking status: Former Games developer  . Smokeless tobacco: Never Used  Substance and Sexual Activity  . Alcohol use: No  . Drug use: No  . Sexual activity: Not on file  Other Topics Concern  . Not on file  Social History Narrative  . Not on file     Review of Systems: A 12 point ROS discussed and pertinent positives are indicated in the HPI above.  All other systems are negative.  Review of Systems  Vital Signs: BP 138/75 (BP Location: Right Arm,  Patient Position: Sitting, Cuff Size: Normal)   Pulse 82   Temp (!) 97.1 F (36.2 C)   SpO2 98%   Physical Exam Targeted exam of her back reveals healed puncture sites from her treatment.   She has no skin wounds.  No reproducible midline pain.  She has evolving ecchymosis of the left gluteal region.  No flank hematoma or ecchymosis.     Imaging: Ir Radiologist Eval & Mgmt  Result Date: 08/21/2017 Please refer to notes tab for details about interventional procedure. (Op Note)   Labs:  CBC: Recent Labs    09/14/16 0505 09/15/16 0452 09/17/16 0625 07/12/17 0750  WBC 9.2 9.4 9.2 6.3  HGB 10.6* 10.9* 11.6* 13.1  HCT 32.3* 33.6* 34.6* 39.9  PLT 267 292 307 246    COAGS: Recent Labs    07/12/17 0750  INR 0.99  APTT 34    BMP: Recent Labs    09/14/16 0505 09/15/16 0452 09/17/16 0625 07/12/17 0750  NA 135 136 135 138  K 3.3* 3.9 4.1 3.6  CL 102 102 102 103  CO2 26 26 23 26   GLUCOSE 97 96 89 98  BUN 5* 6 7 13   CALCIUM 8.5* 8.9 9.0 9.6  CREATININE 0.58 0.56 0.54 0.59  GFRNONAA >60 >60 >60 >60  GFRAA >60 >60 >60 >60    LIVER FUNCTION TESTS: Recent Labs    09/11/16 1120  BILITOT 1.6*  AST 30  ALT 17  ALKPHOS 98  PROT 7.0  ALBUMIN 3.3*    TUMOR MARKERS: No results for input(s): AFPTM, CEA, CA199, CHROMGRNA in the last 8760 hours.  Assessment and Plan:  Ms Ty Hiltsdmonds is a 83 year old female SP L2 and L3 kyphoplasty 07/12/2017 for symptomatic compression fractures.    She is confident that we have helped with her pain and allowed her to achieve comfort during her daily activities, however, our assessment is slightly confounded by secondary pain after 2 recent falls at her facility.    One of these falls has resulted in left buttock bruising/ecchymosis, contributing to the majority of her symptoms.    She has no hip pain or loss of function that would be concerning for another spine fracture or hip fracture at this time.    Plan: - After discussion  with her and her son, we feel that surveillance is reasonable, with no need at this time for advanced imaging.   - We will not schedule any repeat appointment at this time.  - Should she have any worsening back pain/new back pain and she would like an appointment, I would schedule a new consult with VIR with repeat spine x-ray, and then determine the need for any advanced imaging.   - I have encouraged her to continue activities at her facility to maintain weight-bearing activity   Electronically Signed: Gilmer MorWAGNER, Amandine Covino 08/21/2017, 9:45 AM   I spent a total of    25 Minutes in face to face in clinical consultation, greater than 50% of which was counseling/coordinating care for L2 and L3 compression fracture, SP 2 level kyphoplasty.

## 2018-01-06 IMAGING — XA IR KYPHO VERTEBRAL ADDL AGMENTATION
11 series · 12 of 23 positions shown · non-contrast
Comparison: MR 07/01/2017

INDICATION: [AGE] female with a history of compression fracture of L2 and
L3, with limitations of activity given the pain.

She presents today for vertebral augmentation.
EXAM:
IR KYPHO VERTEBRAL LUMBAR AUGMENTATION; IR KYPHO VERTEBRAL ADDL
AGMENTATION

[Series 2: fl angio · 1 of 1 slices shown (1 of 3)]
[im 1/1]
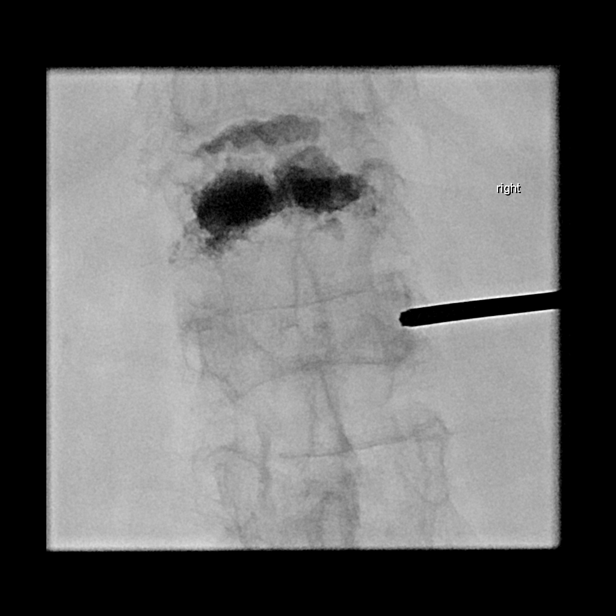

[Series 3: fl angio · 1 of 4 slices shown (2 of 3)]
[im 2/4]
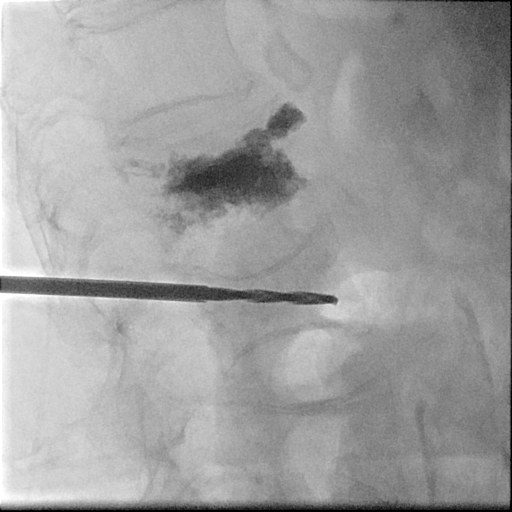

[Series 4: fl angio · 1 of 2 slices shown (3 of 3)]
[im 1/2]
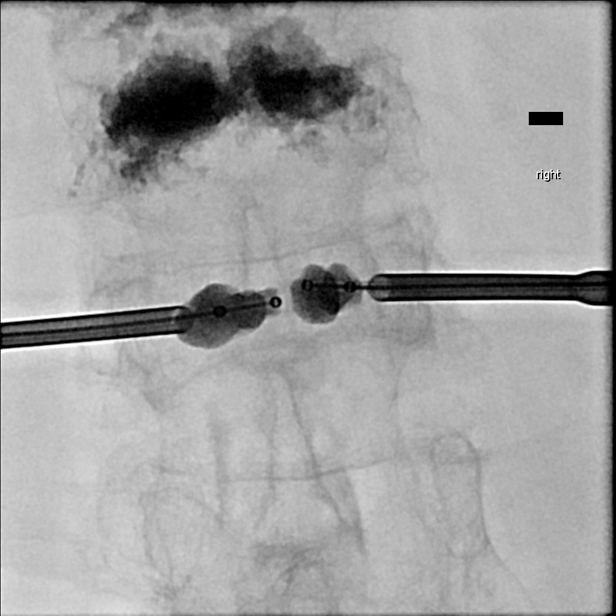

[Series 5: single · 2 of 4 slices shown (1 of 8)]
[im 1/4]
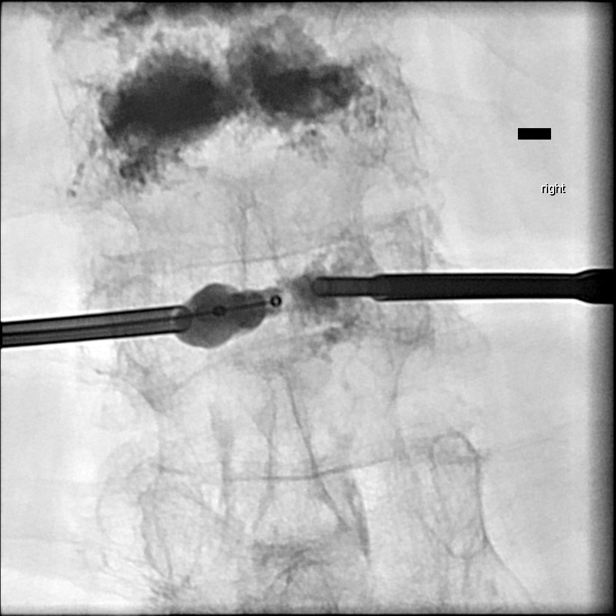
[im 4/4]
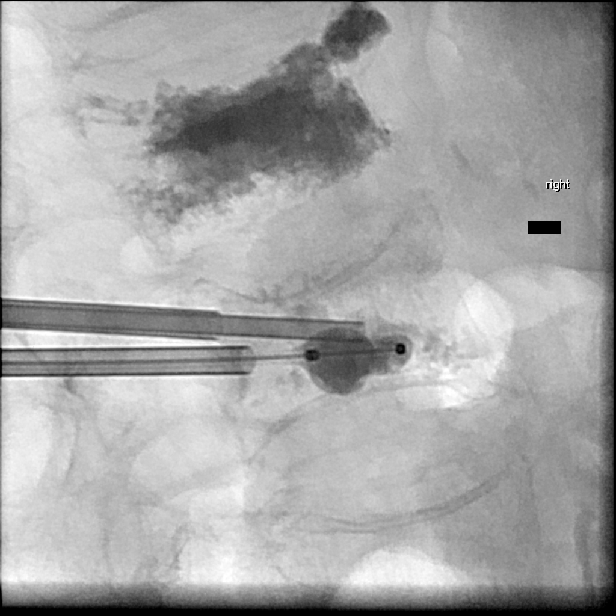

[Series 6: single · 1 of 4 slices shown (2 of 8)]
[im 4/4]
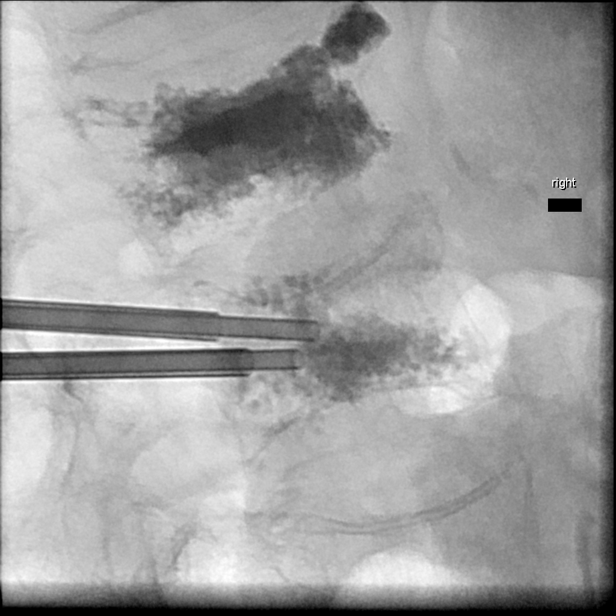

[Series 7: single · 1 of 4 slices shown (3 of 8)]
[im 4/4]
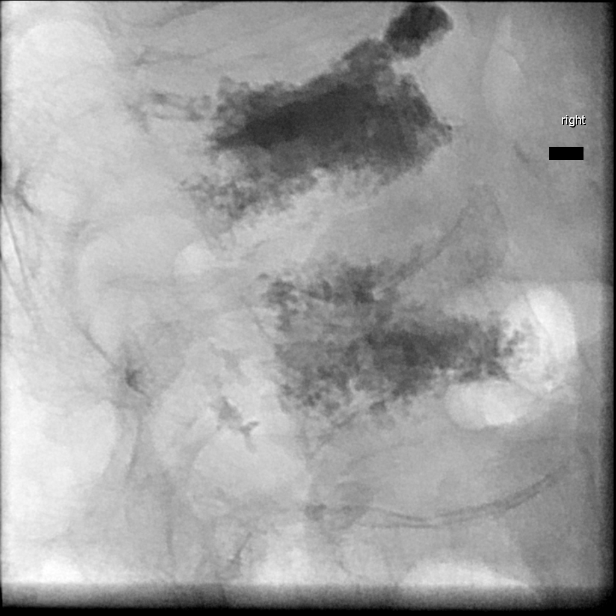

[Series 8: single · 1 of 4 slices shown (4 of 8)]
[im 4/4]
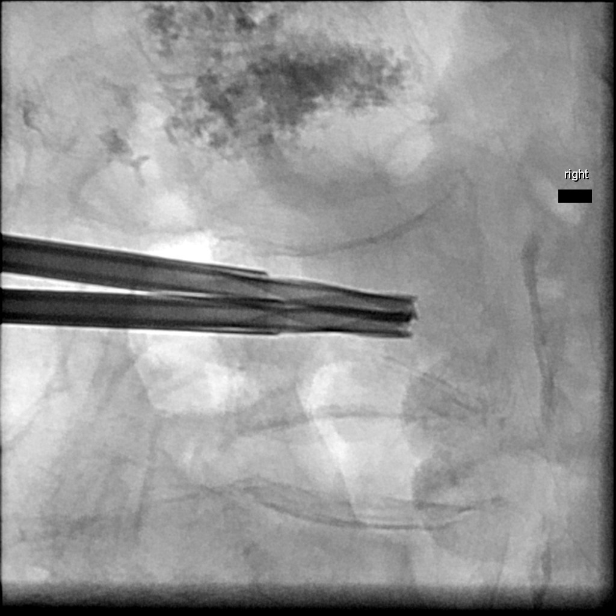

[Series 9: single · 1 of 4 slices shown (5 of 8)]
[im 4/4]
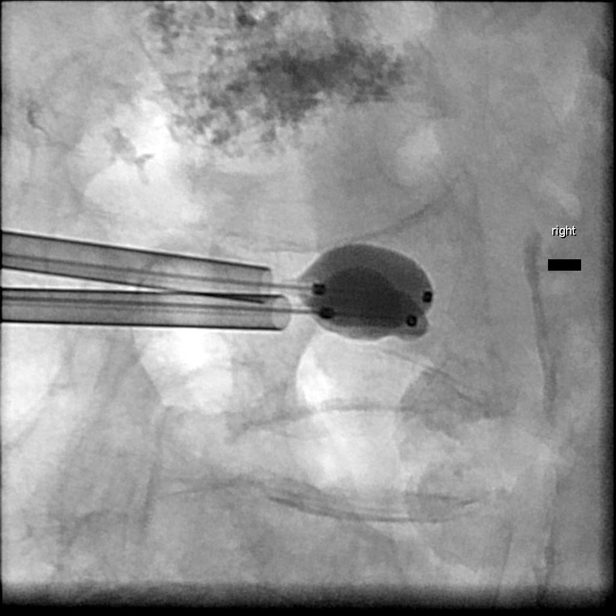

[Series 10: single · 1 of 4 slices shown (6 of 8)]
[im 4/4]
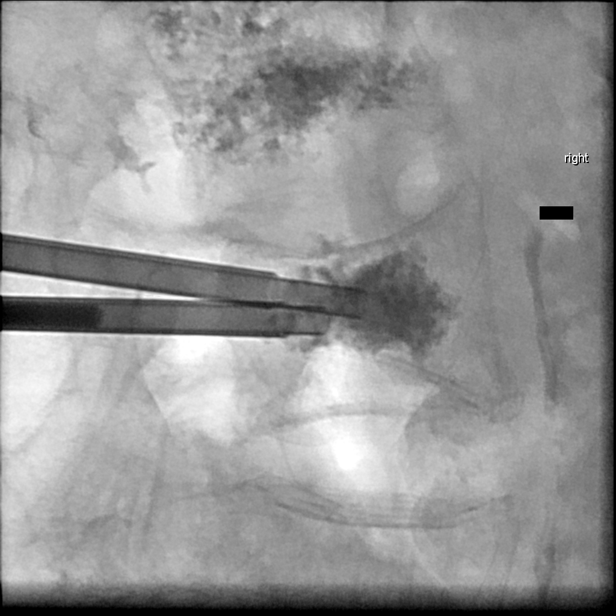

[Series 11: single · 1 of 4 slices shown (7 of 8)]
[im 4/4]
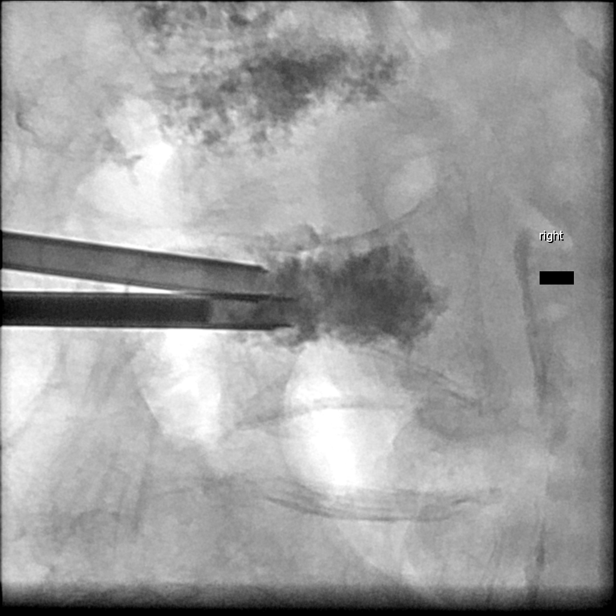

[Series 12: single · 1 of 4 slices shown (8 of 8)]
[im 4/4]
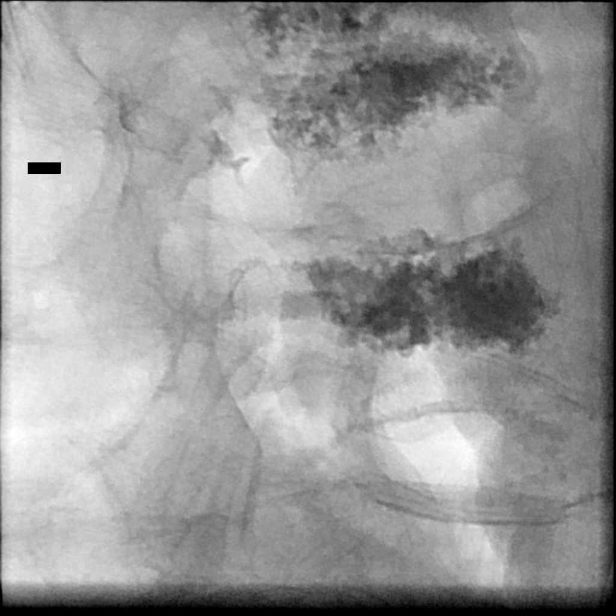

[12 of 23 positions shown; findings below may reference images not displayed]

MEDICATIONS:
As antibiotic prophylaxis, 2.0 g Ancef was ordered pre-procedure and
administered intravenously within 1 hour of incision.

ANESTHESIA/SEDATION:
Moderate (conscious) sedation was employed during this procedure. A
total of Versed 2.0 mg and Fentanyl 62.5 mcg was administered
intravenously.

Moderate Sedation Time: 65 minutes. The patient's level of
consciousness and vital signs were monitored continuously by
radiology nursing throughout the procedure under my direct
supervision.

FLUOROSCOPY TIME:  Fluoroscopy Time: 17 minutes 36 seconds (1,502
mGy)

COMPLICATIONS:
None

PROCEDURE:
Following a full explanation of the procedure along with the
potentially associated complications, a witnessed informed consent
was obtained. Specific risks that were discussed included bleeding,
infection, injury to adjacent structures, neurologic injury,
embolization of cement within the veins, failure of the procedure to
improve pain, need for further procedure/ surgery, cardiopulmonary
collapse, death. The patient understands the risks and wishes to
proceed.

The patient was placed prone on the fluoroscopic table. Nasal oxygen
was administered. Physiologic monitoring was performed throughout
the duration of the procedure. The skin overlying the L2 region was
prepped and draped in the usual sterile fashion. The L2 vertebral
body was identified and the right L2 pedicle was infiltrated with 1%
lidocaine. This was then followed by the advancement of an 8-gauge
[REDACTED] needle through the right pedicle into the anterior
one-third.

The left pedicle at L2 was then infiltrated with 1% lidocaine. A
small incision was made with 11 blade scalpel, and then [REDACTED]
8 gauge needle was advanced through the left pedicle into the
anterior [DATE] of the vertebral body.

Bone auger was then advanced through each of the cannula to identify
the path of the cannula balloons.

Simultaneous inflation of the left and right pedicular cannula
balloons was performed under fluoroscopic observation.

Methylmethacrylate mixture was then reconstituted. Under biplane
intermittent fluoroscopy, the methylmethacrylate was then injected
into the cavity within L2 vertebral body with filling of the
fracture cleft.

No extravasation was noted posteriorly into the spinal canal.
Minimal posterior epidural venous contamination was seen, which did
not migrate.

The needles were then removed. Hemostasis was achieved at the skin
entry site.

We then turned our attention to the more inferior, L3 level.

The L3 vertebral body was confirmed and the right L3 pedicle was
infiltrated with 1% lidocaine. This was then followed by the
advancement of a jamshidi needle through the right pedicle into the
anterior one-third. The coaxial needle was removed, and guide rod
was placed into the vertebral body bone. Needle was removed and the
cannulated auger cannula was advanced into the vertebral body. Guide
rod was removed. Needle position was confirmed with frontal and
lateral projection.

The left pedicle was then infiltrated with 1% lidocaine. A small
incision was made with 11 blade scalpel, and then a jamshidi needle
was advanced through the left pedicle into the anterior [DATE] of the
vertebral body. The coaxial needle was removed, and guide rod was
placed into the vertebral body bone. Needle was removed and the
cannulated auger cannula was advanced into the vertebral body. Guide
rod was removed. Needle position was confirmed with frontal and
lateral projection.

Simultaneous inflation of the left and right pedicular cannula
balloons was then performed under fluoroscopic observation.

Methylmethacrylate mixture was reconstituted. Under biplane
intermittent fluoroscopy, the methylmethacrylate was then injected
into the L3 vertebral body with filling of the fracture cleft.

No extravasation was noted posteriorly into the spinal canal. No
epidural venous contamination was visualized.

There were no acute complications. Patient tolerated the procedure
well. The patient was observed for 30 minutes and returned recovery
in good condition.
IMPRESSION: Status post vertebral augmentation with bipedicular approach
kyphoplasty technique at the L2 and L3 level for severely
symptomatic compression fractures.

## 2018-06-10 DEATH — deceased
